# Patient Record
Sex: Male | Born: 1985 | Race: White | Hispanic: No | Marital: Single | Smoking: Never smoker
Health system: Southern US, Community
[De-identification: ages and names within clinical notes are randomized; demographics above are authoritative.]

## PROBLEM LIST (undated history)

## (undated) DIAGNOSIS — Z8669 Personal history of other diseases of the nervous system and sense organs: Secondary | ICD-10-CM

## (undated) DIAGNOSIS — K529 Noninfective gastroenteritis and colitis, unspecified: Secondary | ICD-10-CM

## (undated) HISTORY — PX: NO PAST SURGERIES: SHX2092

---

## 2012-02-05 ENCOUNTER — Emergency Department: Payer: Self-pay | Admitting: Emergency Medicine

## 2013-12-06 ENCOUNTER — Ambulatory Visit: Payer: Self-pay | Admitting: Family Medicine

## 2014-12-31 ENCOUNTER — Encounter: Payer: Self-pay | Admitting: Family Medicine

## 2014-12-31 ENCOUNTER — Ambulatory Visit (INDEPENDENT_AMBULATORY_CARE_PROVIDER_SITE_OTHER): Payer: Self-pay | Admitting: Family Medicine

## 2014-12-31 VITALS — BP 92/62 | HR 72 | Temp 97.9°F | Resp 16 | Wt 156.6 lb

## 2014-12-31 DIAGNOSIS — L600 Ingrowing nail: Secondary | ICD-10-CM

## 2014-12-31 MED ORDER — CEPHALEXIN 500 MG PO CAPS: 500.0000 mg | ORAL_CAPSULE | Freq: Two times a day (BID) | ORAL | Status: DC

## 2014-12-31 NOTE — Progress Notes (Signed)
Subjective:     Patient ID: Ryan Cooley, male   DOB: January 10, 1986, 29 y.o.   MRN: [id number]  HPI  Chief Complaint  Patient presents with  . Foot Pain    Patient comes in office today concerned about pain in his right foot radiating down to his toes for the past month. Patient states when problem initally began he was seen at [facility] care and was diagnosed with ingrown toenail.   States now the opposite side of his right first toenail is ingrown as well. Reports adjacent second and third toes also have been getting sharp pains in them from time to time. Works at [employer] 30-40 hours/week. States he had partial toenail removal at Urgent Care but does not wish to have procedure today. Has just started Epsom Salt Soaks. No insurance coverage at this time.   Review of Systems  Musculoskeletal:       History or right fourth metatarsal fracture evaluated by podiatry, Dr. Alberteen Spindle.       Objective:   Physical Exam  Constitutional: He appears well-developed and well-nourished. No distress.  Cardiovascular:  Pulses:      Dorsalis pedis pulses are 2+ on the right side.       Posterior tibial pulses are 2+ on the right side.  Skin:  No tenderness or erythema @ second and third toes. First toe has half of his nail remaining after prior procedure  with lateral aspect ingrown with granulation tissue and erythema present.       Assessment:    1. Ingrown right big toenail - cephALEXin (KEFLEX) 500 MG capsule; Take 1 capsule (500 mg total) by mouth 2 (two) times daily.  Dispense: 12 capsule; Refill: 0    Plan:    Epsom Salt Soaks in warm water 1-2 x day. Work excuse for 9/20.

## 2014-12-31 NOTE — Patient Instructions (Signed)
Continue Epsom Salt soaks in warm water 1-2 x day.

## 2015-01-21 ENCOUNTER — Encounter: Payer: Self-pay | Admitting: Family Medicine

## 2015-01-21 ENCOUNTER — Ambulatory Visit: Payer: Self-pay | Admitting: Family Medicine

## 2015-01-21 VITALS — BP 110/64 | HR 72 | Temp 97.6°F | Resp 16 | Wt 155.8 lb

## 2015-01-21 DIAGNOSIS — Z789 Other specified health status: Secondary | ICD-10-CM | POA: Insufficient documentation

## 2015-01-21 DIAGNOSIS — L600 Ingrowing nail: Secondary | ICD-10-CM

## 2015-01-21 NOTE — Patient Instructions (Signed)
Continue salt water soaks and dressing until you see the podiatrist

## 2015-01-21 NOTE — Progress Notes (Signed)
Subjective:     Patient ID: Ryan Cooley, male   DOB: 11/11/1985, 29 y.o.   MRN: [id number]  HPI  Chief Complaint  Patient presents with  . Ingrown Toenail    Patient returns to office today to address ingrown toe nail on his great toe. Patient reports that he completed medication prescribed last office  visit and was soaking feet in Epison salt. Ptient reports that symptoms had improved but over the past week he has noted pain on that toe again.   States it has resumed being painful. He has continued to use Epsom salt soaks and attempted to place a pledget of cotton under the nail to lift it up. Has previously seen [facility] podiatry for an unrelated problem  Review of Systems     Objective:   Physical Exam  Constitutional: He appears well-developed and well-nourished. No distress.  Skin:  Right first ingrown toenail appears worse today with more extensive granulation tissue and mild bleeding noted. He has had opposite side of nail previously removed but appears to have some early separation of the nail from the nail base.       Assessment:    1. Ingrown right big toenail: suspect he will require toenail removal and ablation. - Ambulatory referral to Podiatry    Plan:    Continue soaks and dressing until seen by podiatry.

## 2015-01-25 DIAGNOSIS — S99919A Unspecified injury of unspecified ankle, initial encounter: Secondary | ICD-10-CM | POA: Insufficient documentation

## 2015-01-25 DIAGNOSIS — K529 Noninfective gastroenteritis and colitis, unspecified: Secondary | ICD-10-CM | POA: Insufficient documentation

## 2015-01-25 DIAGNOSIS — H60399 Other infective otitis externa, unspecified ear: Secondary | ICD-10-CM | POA: Insufficient documentation

## 2015-01-25 DIAGNOSIS — Z8669 Personal history of other diseases of the nervous system and sense organs: Secondary | ICD-10-CM | POA: Insufficient documentation

## 2015-01-25 DIAGNOSIS — S8990XA Unspecified injury of unspecified lower leg, initial encounter: Secondary | ICD-10-CM | POA: Insufficient documentation

## 2015-01-25 DIAGNOSIS — A09 Infectious gastroenteritis and colitis, unspecified: Secondary | ICD-10-CM

## 2015-01-25 DIAGNOSIS — M722 Plantar fascial fibromatosis: Secondary | ICD-10-CM | POA: Insufficient documentation

## 2015-01-25 DIAGNOSIS — Z789 Other specified health status: Secondary | ICD-10-CM | POA: Insufficient documentation

## 2015-01-25 DIAGNOSIS — K120 Recurrent oral aphthae: Secondary | ICD-10-CM | POA: Insufficient documentation

## 2015-03-04 ENCOUNTER — Ambulatory Visit (INDEPENDENT_AMBULATORY_CARE_PROVIDER_SITE_OTHER): Payer: Self-pay | Admitting: Family Medicine

## 2015-03-04 ENCOUNTER — Encounter: Payer: Self-pay | Admitting: Family Medicine

## 2015-03-04 VITALS — BP 110/66 | HR 69 | Temp 97.7°F | Resp 16 | Wt 159.0 lb

## 2015-03-04 DIAGNOSIS — J0130 Acute sphenoidal sinusitis, unspecified: Secondary | ICD-10-CM

## 2015-03-04 DIAGNOSIS — J3089 Other allergic rhinitis: Secondary | ICD-10-CM

## 2015-03-04 DIAGNOSIS — J309 Allergic rhinitis, unspecified: Secondary | ICD-10-CM | POA: Insufficient documentation

## 2015-03-04 MED ORDER — AMOXICILLIN 500 MG PO CAPS: ORAL_CAPSULE | ORAL | Status: DC

## 2015-03-04 NOTE — Progress Notes (Signed)
Subjective:     Patient ID: Ryan Cooley, male   DOB: [birth year], 29 y.o.   MRN: [id number]  HPI  Chief Complaint  Patient presents with  . Headache    Patient comes in office today with complaints of headache and congestion over the past 3 days. Patient states that headache is located on top of his head and is described as a pressure feeling. Patient reports taking otc Advil for relief.   States he has perennial sinus congestion for which he uses steroid nasal spray. In the last 3 days he has developed temporal to parietal pressure headache associated with purulent drainage. No sore throat, sinus drainage, or cough reported. Has been using two Advil every 4-6 hours for pain   Review of Systems  Constitutional: Negative for fever and chills.  Neurological:       No hx of headache syndromes. Reports his brother has migraines.       Objective:   Physical Exam  Constitutional: He appears well-developed and well-nourished. No distress.  Ears: T.M's intact without inflammation Sinuses: non-tender Throat: no tonsillar enlargement or exudate Neck: no cervical adenopathy Lungs: clear     Assessment:    1. Acute sphenoidal sinusitis, recurrence not specified - amoxicillin (AMOXIL) 500 MG capsule; Two pills twice daily  Dispense: 40 capsule; Refill: 0    Plan:    Discussed use of decongestants and also possible diagnosis of rebound headache. Asked him to stop Advil.

## 2015-03-04 NOTE — Patient Instructions (Signed)
Stop all pain medication if possible. Use Sudafed or similar decongestant for congestion.

## 2015-04-19 ENCOUNTER — Ambulatory Visit (INDEPENDENT_AMBULATORY_CARE_PROVIDER_SITE_OTHER): Payer: Self-pay | Admitting: Family Medicine

## 2015-04-19 ENCOUNTER — Encounter: Payer: Self-pay | Admitting: Family Medicine

## 2015-04-19 VITALS — BP 110/58 | HR 68 | Temp 97.7°F | Resp 16 | Wt 155.4 lb

## 2015-04-19 DIAGNOSIS — J069 Acute upper respiratory infection, unspecified: Secondary | ICD-10-CM

## 2015-04-19 NOTE — Patient Instructions (Signed)
Discussed use of Mucinex D for congestion, Delsym for cough, and Benadryl for postnasal drainage 

## 2015-04-19 NOTE — Progress Notes (Signed)
Subjective:     Patient ID: Ryan Cooley, male   DOB: 1986/01/25, 30 y.o.   MRN: [id number]  HPI  Chief Complaint  Patient presents with  . Cough    Patient comes in office today with concerns of cough and headache for the pat 4 days. Patient states that he has a sore throat that is intermittent and described as dull. Patient has taken otc: decongestant, chloroseptic spray, cough syrup and allergy medication  No fever,chills, or body aches reported. Works in [employer].   Review of Systems     Objective:   Physical Exam  Constitutional: He appears well-developed and well-nourished. No distress.  Ears: T.M's intact without inflammation Throat: no tonsillar enlargement or exudate Neck: no cervical adenopathy Lungs: clear     Assessment:    1. Upper respiratory infection    Plan:    Discussed use of Mucinex D for congestion, Delsym for cough, and Benadryl for postnasal drainage. Work excuse for 1/6-1/8.

## 2015-05-07 ENCOUNTER — Encounter: Payer: Self-pay | Admitting: Family Medicine

## 2015-05-07 ENCOUNTER — Ambulatory Visit (INDEPENDENT_AMBULATORY_CARE_PROVIDER_SITE_OTHER): Payer: Self-pay | Admitting: Family Medicine

## 2015-05-07 VITALS — BP 96/60 | HR 70 | Temp 97.6°F | Resp 16 | Wt 153.2 lb

## 2015-05-07 DIAGNOSIS — J069 Acute upper respiratory infection, unspecified: Secondary | ICD-10-CM

## 2015-05-07 NOTE — Patient Instructions (Signed)
Add decongestants like Sudafed PE or Mucinex D. May add Benadryl or Nyquil at night if post nasal drip is keeping you awake

## 2015-05-07 NOTE — Progress Notes (Signed)
Subjective:     Patient ID: Ryan Cooley, male   DOB: 1986/01/31, 30 y.o.   MRN: [id number]  HPI  Chief Complaint  Patient presents with  . Cough    Patient comes in office today with concerns of cough and congestion for the past 5 days. Patient reports that last visit he had similar symptoms but they did resolve. Patient complains of the following symptoms: productive cough, runny nose, congestion and post nasal drip. Patient has tried otc Deslym and Mucinex DM for relief  Denies specific fever but has felt chills today.   Review of Systems     Objective:   Physical Exam  Constitutional: He appears well-developed and well-nourished. No distress.  Ears: T.M's intact without inflammation Throat: no tonsillar enlargement or exudate Neck: no cervical adenopathy Lungs: clear     Assessment:    1. Upper respiratory infection     Plan:    Discussed otc medication. Work excuse for 1/24-1/26.

## 2015-05-24 ENCOUNTER — Emergency Department
Admission: EM | Admit: 2015-05-24 | Discharge: 2015-05-24 | Disposition: A | Discharge status: Home / Self Care | Payer: Self-pay | Attending: Emergency Medicine | Admitting: Emergency Medicine

## 2015-05-24 ENCOUNTER — Encounter: Payer: Self-pay | Admitting: Emergency Medicine

## 2015-05-24 ENCOUNTER — Emergency Department: Payer: Self-pay

## 2015-05-24 DIAGNOSIS — M545 Low back pain, unspecified: Secondary | ICD-10-CM

## 2015-05-24 DIAGNOSIS — G8929 Other chronic pain: Secondary | ICD-10-CM | POA: Insufficient documentation

## 2015-05-24 DIAGNOSIS — Z79899 Other long term (current) drug therapy: Secondary | ICD-10-CM | POA: Insufficient documentation

## 2015-05-24 MED ORDER — METHOCARBAMOL 750 MG PO TABS
750.0000 mg | ORAL_TABLET | Freq: Four times a day (QID) | ORAL | Status: DC
Start: 2015-05-24 — End: 2015-10-31

## 2015-05-24 MED ORDER — TRAMADOL HCL 50 MG PO TABS: 50.0000 mg | ORAL_TABLET | Freq: Four times a day (QID) | ORAL | Status: DC | PRN

## 2015-05-24 MED ORDER — IBUPROFEN 800 MG PO TABS
800.0000 mg | ORAL_TABLET | Freq: Once | ORAL | Status: AC
  Administered 2015-05-24: 800 mg via ORAL
  Filled 2015-05-24: qty 1

## 2015-05-24 MED ORDER — METHOCARBAMOL 500 MG PO TABS
1000.0000 mg | ORAL_TABLET | Freq: Once | ORAL | Status: AC
  Administered 2015-05-24: 1000 mg via ORAL
  Filled 2015-05-24: qty 2

## 2015-05-24 MED ORDER — OXYCODONE-ACETAMINOPHEN 5-325 MG PO TABS
1.0000 | ORAL_TABLET | Freq: Once | ORAL | Status: AC
  Administered 2015-05-24: 1 via ORAL
  Filled 2015-05-24: qty 1

## 2015-05-24 MED ORDER — IBUPROFEN 600 MG PO TABS: 600.0000 mg | ORAL_TABLET | Freq: Four times a day (QID) | ORAL | Status: DC | PRN

## 2015-05-24 NOTE — Discharge Instructions (Signed)

## 2015-05-24 NOTE — ED Provider Notes (Signed)
[facility] Emergency Department Provider Note  ____________________________________________  Time seen: Approximately 5:05 PM  I have reviewed the triage vital signs and the nursing notes.   HISTORY  Chief Complaint Back Pain    HPI Ryan Cooley is a 30 y.o. male patient complaining of acute onset of low back pain 2 hours ago secondary to infection incident. Patient has a history of chronic back pain for about 3 years and went to see a Chiropractor yesterday. He felt better after the session. No palliative measures taken to today's complaint. He does rate his pain as 8/10. Patient denies any radicular component to his pain. He denies any bladder or bowel dysfunction. Patient does not recall in the emergency of his back in the past 3-4 years.  History reviewed. No pertinent past medical history.  Patient Active Problem List   Diagnosis Date Noted  . Allergic rhinitis 03/04/2015    Past Surgical History  Procedure Laterality Date  . No past surgeries      Current Outpatient Rx  Name  Route  Sig  Dispense  Refill  . ibuprofen (ADVIL,MOTRIN) 100 MG tablet   Oral   Take 100 mg by mouth every 6 (six) hours as needed for fever.         Marland Kitchen ibuprofen (ADVIL,MOTRIN) 600 MG tablet   Oral   Take 1 tablet (600 mg total) by mouth every 6 (six) hours as needed.   30 tablet   0   . methocarbamol (ROBAXIN-750) 750 MG tablet   Oral   Take 1 tablet (750 mg total) by mouth 4 (four) times daily.   20 tablet   0   . Multiple Vitamin (MULTI-VITAMINS) TABS   Oral   Take by mouth.         . traMADol (ULTRAM) 50 MG tablet   Oral   Take 1 tablet (50 mg total) by mouth every 6 (six) hours as needed.   20 tablet   0   . triamcinolone (NASACORT ALLERGY 24HR) 55 MCG/ACT AERO nasal inhaler   Nasal   Place 2 sprays into the nose daily.           Allergies Review of patient's allergies indicates no known allergies.  Family History  Problem Relation  Age of Onset  . Depression Mother   . Diabetes Father   . Healthy Brother   . Diabetes Maternal Grandmother   . Heart disease Maternal Grandmother   . Cancer Maternal Grandfather     lung  . Healthy Brother     Social History Social History  Substance Use Topics  . Smoking status: Never Smoker   . Smokeless tobacco: Never Used  . Alcohol Use: No    Review of Systems Constitutional: No fever/chills Eyes: No visual changes. ENT: No sore throat. Cardiovascular: Denies chest pain. Respiratory: Denies shortness of breath. Gastrointestinal: No abdominal pain.  No nausea, no vomiting.  No diarrhea.  No constipation. Genitourinary: Negative for dysuria. Musculoskeletal: Positive for chronic back pain  Skin: Negative for rash. Neurological: Negative for headaches, focal weakness or numbness.   ____________________________________________   PHYSICAL EXAM:  VITAL SIGNS: ED Triage Vitals  Enc Vitals Group     BP 05/24/15 1652 118/65 mmHg     Pulse Rate 05/24/15 1652 63     Resp 05/24/15 1652 18     Temp 05/24/15 1652 98 F (36.7 C)     Temp Source 05/24/15 1652 Oral     SpO2 05/24/15 1652  99 %     Weight 05/24/15 1652 150 lb (68.04 kg)     Height 05/24/15 1652  (1.626 m)     Head Cir --      Peak Flow --      Pain Score 05/24/15 1651 8     Pain Loc --      Pain Edu? --      Excl. in GC? --     Constitutional: Alert and oriented. Well appearing and in no acute distress. Eyes: Conjunctivae are normal. PERRL. EOMI. Head: Atraumatic. Nose: No congestion/rhinnorhea. Mouth/Throat: Mucous membranes are moist.  Oropharynx non-erythematous. Neck: No stridor.  No cervical spine tenderness to palpation. Hematological/Lymphatic/Immunilogical: No cervical lymphadenopathy. Cardiovascular: Normal rate, regular rhythm. Grossly normal heart sounds.  Good peripheral circulation. Respiratory: Normal respiratory effort.  No retractions. Lungs CTAB. Gastrointestinal: Soft and  nontender. No distention. No abdominal bruits. No CVA tenderness. Musculoskeletal: No obvious deformity of the low back. Moderate guarding palpation L4-S1. Decreased range of motion with flexion. Patient Leg test.  Neurologic:  Normal speech and language. No gross focal neurologic deficits are appreciated. No gait instability. Skin:  Skin is warm, dry and intact. No rash noted. Psychiatric: Mood and affect are normal. Speech and behavior are normal.  ____________________________________________   LABS (all labs ordered are listed, but only abnormal results are displayed)  Labs Reviewed - No data to display ____________________________________________  EKG   ____________________________________________  RADIOLOGY  No acute findings on x-ray. I, Joni Reining, personally viewed and evaluated these images (plain radiographs) as part of my medical decision making, as well as reviewing the written report by the radiologist.  ____________________________________________   PROCEDURES  Procedure(s) performed: None  Critical Care performed: No  ____________________________________________   INITIAL IMPRESSION / ASSESSMENT AND PLAN / ED COURSE  Pertinent labs & imaging results that were available during my care of the patient were reviewed by me and considered in my medical decision making (see chart for details).  Treatment low back pain. Patient given discharge care instructions. Patient advised to wear lumbar support while working. Patient get a prescription for tramadol, ibuprofen, Robaxin. Patient advised follow-up family doctor for continued care. Patient given a work note. ____________________________________________   FINAL CLINICAL IMPRESSION(S) / ED DIAGNOSES  Final diagnoses:  Back pain at L4-L5 level      Joni Reining, PA-C 05/24/15 1807  Jennye Moccasin, MD 05/24/15 [phone]

## 2015-05-24 NOTE — ED Notes (Signed)
Patient c/o lower back pain. States he was at the chiropractor yesterday. Patient states he has had problems with his back for about 3 years, but "its normally not this bad".

## 2015-05-24 NOTE — ED Notes (Signed)
Reports lower back pain onset today.

## 2015-06-13 ENCOUNTER — Encounter: Payer: Self-pay | Admitting: Family Medicine

## 2015-06-13 ENCOUNTER — Other Ambulatory Visit: Payer: Self-pay | Admitting: Family Medicine

## 2015-06-13 ENCOUNTER — Ambulatory Visit (INDEPENDENT_AMBULATORY_CARE_PROVIDER_SITE_OTHER): Payer: Self-pay | Admitting: Family Medicine

## 2015-06-13 VITALS — BP 102/62 | HR 60 | Temp 97.7°F | Resp 16 | Wt 150.4 lb

## 2015-06-13 DIAGNOSIS — L02439 Carbuncle of limb, unspecified: Secondary | ICD-10-CM

## 2015-06-13 MED ORDER — HYDROCODONE-ACETAMINOPHEN 5-325 MG PO TABS: ORAL_TABLET | ORAL | Status: DC

## 2015-06-13 NOTE — Progress Notes (Signed)
Subjective:     Patient ID: AYUSH BOULET, male   DOB: [birth year], 30 y.o.   MRN: [id number]  HPI  Chief Complaint  Patient presents with  . Skin Problem    Patient comes in office today for skin check, patient states that early last week he had noticed a "bump" underneath his left arm pit area. Patient reports that "bump" has grown in size and has become painful to the touch, he has been applying warm compress to area for relief.   No hx of or exposure to MRSA   Review of Systems     Objective:   Physical Exam  Constitutional: He appears well-developed and well-nourished. He appears distressed (mild pain).  Skin:  L axilla with large carbuncle Procedure note: Cleansed with betadine, I & D performed with large amount of pus return. C &S sent.       Assessment:    1. Carbuncle of axilla - INCISION AND DRAINAGE; Future - Wound culture - HYDROcodone-acetaminophen (NORCO/VICODIN) 5-325 MG tablet; One every 4-6 hours as needed for pain  Dispense: 14 tablet; Refill: 0    Plan:    Cleanse with soap and water. Further f/u pending culture result.

## 2015-06-13 NOTE — Patient Instructions (Signed)
Cleanse with soap and water and apply dressing. We will call you with the culture result.

## 2015-06-17 LAB — WOUND CULTURE

## 2015-06-18 ENCOUNTER — Telehealth: Payer: Self-pay

## 2015-06-18 NOTE — Telephone Encounter (Signed)
States wound healing well and no longer draining. Discussed results of wound culture.

## 2015-06-18 NOTE — Telephone Encounter (Signed)
Pt is returning call.  CB#[phone]/MW

## 2015-06-18 NOTE — Telephone Encounter (Signed)
[id number] 

## 2015-06-18 NOTE — Telephone Encounter (Signed)
-----   Message from Anola Gurneyobert Chauvin, [place] sent at 06/17/2015  4:56 PM EST ----- Routine staphylococcus (not MRSA) on wound culture. Is he healing well?

## 2015-10-31 ENCOUNTER — Ambulatory Visit (INDEPENDENT_AMBULATORY_CARE_PROVIDER_SITE_OTHER): Payer: Self-pay | Admitting: Family Medicine

## 2015-10-31 ENCOUNTER — Encounter: Payer: Self-pay | Admitting: Family Medicine

## 2015-10-31 VITALS — BP 124/72 | HR 68 | Temp 98.2°F | Resp 16 | Wt 151.0 lb

## 2015-10-31 DIAGNOSIS — L03116 Cellulitis of left lower limb: Secondary | ICD-10-CM

## 2015-10-31 MED ORDER — AMOXICILLIN-POT CLAVULANATE 875-125 MG PO TABS: 1.0000 | ORAL_TABLET | Freq: Two times a day (BID) | ORAL | Status: DC

## 2015-10-31 NOTE — Progress Notes (Signed)
Subjective:     Patient ID: Bruce Donathric J Chapdelaine, male   DOB: May 08, 1985, 30 y.o.   MRN: [id number]  HPI  Chief Complaint  Patient presents with  . Knee Pain    x 4 days. Is red and swollen with red streak running up left thigh. Has splinter in knee, which pt removed "sterilized needle".   States it started out as a pimple and progressively got more painful and swollen over the course of the week. He has been applying warm compresses and peroxide. Recent axillary carbuncle with Non-MRSA staph on culture. 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   Review of Systems     Objective:   Physical Exam  Constitutional: He appears well-developed and well-nourished. No distress.  Skin:  Left anterior knee is inflamed and mildly swollen. Streak is noted running proximally. Small central eschar at site of prior "bathroom" surgery. No pointing but is very tender to the touch.       Assessment:    1. Cellulitis of knee, left - amoxicillin-clavulanate (AUGMENTIN) 875-125 MG tablet; Take 1 tablet by mouth 2 (two) times daily.  Dispense: 20 tablet; Refill: 0    Plan:    Report to Urgent Care over the weekend if not improving with less erythema and tenderness.

## 2015-10-31 NOTE — Patient Instructions (Signed)
Continue warm compresses. If redness and pain not improving over the weekend report to Urgent Care.

## 2016-01-30 ENCOUNTER — Ambulatory Visit (INDEPENDENT_AMBULATORY_CARE_PROVIDER_SITE_OTHER): Payer: Self-pay | Admitting: Family Medicine

## 2016-01-30 ENCOUNTER — Encounter: Payer: Self-pay | Admitting: Family Medicine

## 2016-01-30 VITALS — BP 106/60 | HR 72 | Temp 97.5°F | Resp 16 | Wt 157.2 lb

## 2016-01-30 DIAGNOSIS — L02439 Carbuncle of limb, unspecified: Secondary | ICD-10-CM

## 2016-01-30 MED ORDER — AMOXICILLIN-POT CLAVULANATE 875-125 MG PO TABS: 1.0000 | ORAL_TABLET | Freq: Two times a day (BID) | ORAL | 0 refills | Status: DC

## 2016-01-30 NOTE — Progress Notes (Signed)
Subjective:     Patient ID: Ryan Cooley, male   DOB: 12-17-1985, 30 y.o.   MRN: [id number]  HPI  Chief Complaint  Patient presents with  . Skin Problem    Patient comes in office today with concerns of a possible knot on skin for the past week. Patient states that "knot" has grown larger and painful in the past two days. Patient denies any history of MRSA  infection.  Reports he did have a small pus pocket which he attempted to squeeze. Prior hx of staph axilla carbuncle.   Review of Systems     Objective:   Physical Exam  Constitutional: He appears well-developed and well-nourished. No distress.  Skin:  Left anterior thigh 6 cm area of erythema with 1 cm central pus pocket. Procedure note: Cleansed with alcohol and infiltrated with xylocaine with Epi. I & D performed with small amount of pus return. C & S obtained.       Assessment:    1. Carbuncle of thigh - WOUND CULTURE - INCISION AND DRAINAGE; Future - amoxicillin-clavulanate (AUGMENTIN) 875-125 MG tablet; Take 1 tablet by mouth 2 (two) times daily.  Dispense: 14 tablet; Refill: 0    Plan:    May hold antibiotic until culture available. May start abx if sx worsening prior to culture result. Cleanse with soap and water daily and apply dressing.

## 2016-01-30 NOTE — Patient Instructions (Addendum)
Clean wound with soap and water and apply dressing. We will call you with the culture report. For future pimples use an antibiotic cream or ointment like Triple antibiotic.

## 2016-02-01 LAB — WOUND CULTURE

## 2016-02-04 ENCOUNTER — Telehealth: Payer: Self-pay

## 2016-02-04 NOTE — Telephone Encounter (Signed)
[id number] 

## 2016-02-04 NOTE — Telephone Encounter (Signed)
-----   Message from Anola Gurneyobert Chauvin, [place] sent at 02/03/2016  7:31 AM EDT ----- Routine staph infection. If wound is looking and feeling better probably does not need antibiotics.

## 2016-02-05 NOTE — Telephone Encounter (Signed)
Patient was advised he states that he does not have any drainage from would but area is still red, site that was cutt open has now scabbed over. Patient was advised to monitor and call back if symptoms worsen. KW

## 2016-07-15 ENCOUNTER — Telehealth: Payer: Self-pay

## 2016-07-15 ENCOUNTER — Ambulatory Visit (INDEPENDENT_AMBULATORY_CARE_PROVIDER_SITE_OTHER): Payer: Self-pay | Admitting: Physician Assistant

## 2016-07-15 ENCOUNTER — Encounter: Payer: Self-pay | Admitting: Physician Assistant

## 2016-07-15 VITALS — BP 104/68 | HR 88 | Temp 97.9°F | Resp 16 | Wt 156.0 lb

## 2016-07-15 DIAGNOSIS — A084 Viral intestinal infection, unspecified: Secondary | ICD-10-CM

## 2016-07-15 NOTE — Patient Instructions (Addendum)
Immodium for diarrhea. May continue with pepto bismol. Tums for acid/hearturn symptoms. Bland diet.  Viral Gastroenteritis, Adult Viral gastroenteritis is also known as the stomach flu. This condition is caused by certain germs (viruses). These germs can be passed from person to person very easily (are very contagious). This condition can cause sudden watery poop (diarrhea), fever, and throwing up (vomiting). Having watery poop and throwing up can make you feel weak and cause you to get dehydrated. Dehydration can make you tired and thirsty, make you have a dry mouth, and make it so you pee (urinate) less often. Older adults and people with other diseases or a weak defense system (immune system) are at higher risk for dehydration. It is important to replace the fluids that you lose from having watery poop and throwing up. Follow these instructions at home: Follow instructions from your doctor about how to care for yourself at home. Eating and drinking   Follow these instructions as told by your doctor:  Take an oral rehydration solution (ORS). This is a drink that is sold at pharmacies and stores.  Drink clear fluids in small amounts as you are able, such as:  Water.  Ice chips.  Diluted fruit juice.  Low-calorie sports drinks.  Eat bland, easy-to-digest foods in small amounts as you are able, such as:  Bananas.  Applesauce.  Rice.  Low-fat (lean) meats.  Toast.  Crackers.  Avoid fluids that have a lot of sugar or caffeine in them.  Avoid alcohol.  Avoid spicy or fatty foods. General instructions   Drink enough fluid to keep your pee (urine) clear or pale yellow.  Wash your hands often. If you cannot use soap and water, use hand sanitizer.  Make sure that all people in your home wash their hands well and often.  Rest at home while you get better.  Take over-the-counter and prescription medicines only as told by your doctor.  Watch your condition for any  changes.  Take a warm bath to help with any burning or pain from having watery poop.  Keep all follow-up visits as told by your doctor. This is important. Contact a doctor if:  You cannot keep fluids down.  Your symptoms get worse.  You have new symptoms.  You feel light-headed or dizzy.  You have muscle cramps. Get help right away if:  You have chest pain.  You feel very weak or you pass out (faint).  You see blood in your throw-up.  Your throw-up looks like coffee grounds.  You have bloody or black poop (stools) or poop that look like tar.  You have a very bad headache, a stiff neck, or both.  You have a rash.  You have very bad pain, cramping, or bloating in your belly (abdomen).  You have trouble breathing.  You are breathing very quickly.  Your heart is beating very quickly.  Your skin feels cold and clammy.  You feel confused.  You have pain when you pee.  You have signs of dehydration, such as:  Dark pee, hardly any pee, or no pee.  Cracked lips.  Dry mouth.  Sunken eyes.  Sleepiness.  Weakness. This information is not intended to replace advice given to you by your health care provider. Make sure you discuss any questions you have with your health care provider. Document Released: 09/16/2007 Document Revised: 10/18/2015 Document Reviewed: 12/04/2014 Elsevier Interactive Patient Education  2017 [employer].

## 2016-07-15 NOTE — Progress Notes (Signed)
Patient: Ryan Cooley Male    DOB: 06-29-1985   31 y.o.   MRN: [id number] Visit Date: 07/15/2016  Today's Provider: Trey Sailors, PA-C   Chief Complaint  Patient presents with  . Abdominal Pain   Subjective:    Abdominal Pain  This is a new problem. The current episode started in the past 7 days (x 3 days). The problem has been gradually worsening. The pain is located in the periumbilical region. The pain is moderate. Quality: "general discomfort" The abdominal pain does not radiate. Associated symptoms include anorexia, diarrhea, headaches and nausea. Pertinent negatives include no belching, constipation, dysuria, fever, flatus, frequency, hematochezia, hematuria, melena, vomiting or weight loss. The pain is aggravated by eating. The pain is relieved by bowel movements. Treatments tried: Pepto Bismol. The treatment provided no relief.   Patient with no contributory PMH presenting today with diarrhea since Sunday. Has multiple sick contacts with similar symptoms including his parents and siblings. Mild epigastric pain. Episodes of watery diarrhea on Sunday that improved to slightly formed diarrhea currently. Intermittent but rare nausea, no vomiting. Is able to keep down food with some indigestion. Drinking okay. No alcohol use. No radiation of epigastric tenderness. Urinating okay. Requesting work note.    No Known Allergies   Current Outpatient Prescriptions:  .  Cholecalciferol (VITAMIN D3) 1000 units CAPS, Take 1 capsule by mouth daily., Disp: , Rfl:  .  ibuprofen (ADVIL,MOTRIN) 600 MG tablet, Take 1 tablet (600 mg total) by mouth every 6 (six) hours as needed., Disp: 30 tablet, Rfl: 0 .  Multiple Vitamin (MULTI-VITAMINS) TABS, Take by mouth., Disp: , Rfl:  .  triamcinolone (NASACORT ALLERGY 24HR) 55 MCG/ACT AERO nasal inhaler, Place 2 sprays into the nose daily., Disp: , Rfl:   Review of Systems  Constitutional: Negative for fever and weight loss.  Gastrointestinal:  Positive for abdominal pain, anorexia, diarrhea and nausea. Negative for constipation, flatus, hematochezia, melena and vomiting.  Genitourinary: Negative for dysuria, frequency and hematuria.  Neurological: Positive for headaches.    Social History  Substance Use Topics  . Smoking status: Never Smoker  . Smokeless tobacco: Never Used  . Alcohol use No   Objective:   BP 104/68 (BP Location: Left Arm, Patient Position: Sitting, Cuff Size: Normal)   Pulse 88   Temp 97.9 F (36.6 C) (Oral)   Resp 16   Wt 156 lb (70.8 kg)   BMI 26.78 kg/m  Vitals:   07/15/16 1604  BP: 104/68  Pulse: 88  Resp: 16  Temp: 97.9 F (36.6 C)  TempSrc: Oral  Weight: 156 lb (70.8 kg)     Physical Exam  Constitutional: He appears well-developed and well-nourished. No distress.  Cardiovascular: Normal rate and regular rhythm.   Pulmonary/Chest: Effort normal and breath sounds normal.  Abdominal: He exhibits no distension. Bowel sounds are increased. There is tenderness in the epigastric area. There is no rebound and no guarding.  Vitals reviewed.       Assessment & Plan:     1. Viral gastroenteritis  Hydrate well, immodium for diarrhea. Can use pepto bismol for digestion. May use tums if having acid reflux. Should resolve soon. Call back if needed. Provided worknote today.  The entirety of the information documented in the History of Present Illness, Review of Systems and Physical Exam were personally obtained by me. Portions of this information were initially documented by Irving Burton D and reviewed by me for thoroughness and accuracy.   Return  if symptoms worsen or fail to improve.           Trey Sailors, PA-C  [facility]

## 2016-07-15 NOTE — Telephone Encounter (Signed)
Patient called stating that he has had diarrhea, abdominal pain and nausea for the past 3 days. Patient states that the abdominal pain worsens when he eats. Patient has been drinking plenty of fluids. Patient denies any fever or body aches. Appointment has been scheduled today at 4pm. This is the soonest that patient could come in. Patient is fatigued because he has been up taking his relative to doctors appointments.

## 2016-09-24 ENCOUNTER — Ambulatory Visit (INDEPENDENT_AMBULATORY_CARE_PROVIDER_SITE_OTHER): Payer: Self-pay | Admitting: Family Medicine

## 2016-09-24 ENCOUNTER — Encounter: Payer: Self-pay | Admitting: Family Medicine

## 2016-09-24 VITALS — BP 110/74 | HR 64 | Temp 97.8°F | Resp 16 | Wt 159.2 lb

## 2016-09-24 DIAGNOSIS — J069 Acute upper respiratory infection, unspecified: Secondary | ICD-10-CM

## 2016-09-24 NOTE — Progress Notes (Signed)
Subjective:     Patient ID: Ryan Cooley, male   DOB: Aug 18, 1985, 31 y.o.   MRN: [id number]  HPI  Chief Complaint  Patient presents with  . Laryngitis    Patient comes in office today with complaints of layrngitis and the feeling of "raw throat" for the past 4 days. Patient denies that he has a sore throat or difficulty swallowing liquids or solifs. Patient states that he has had a productive cough with yellow phlegm and has been taking otc Dayquil  Reports chronic sinus congestion he attributes to allergies prior to the onset of cold-like sx on 6/10. No fever reported. Has been using one squirt of Nasacort per nostril. 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   Review of Systems     Objective:   Physical Exam  Constitutional: He appears well-developed and well-nourished. No distress.  Ears: T.M's dull and partially obstructed by cerumen. Throat: no tonsillar enlargement or exudate Neck: no cervical adenopathy Lungs: clear     Assessment:    1. Viral upper respiratory tract infection    Plan:    Discussed otc medication and increased dose of Nasacort to two squirts each nostril daily   Work excuse for 6/14-6/16/18.

## 2016-09-24 NOTE — Patient Instructions (Signed)
Discussed use of Mucinex D for congestion and increasing Nasacort to two squirts each nostril daily. May use Delsym for cough. Let me know if your not improving after this weekend.

## 2018-03-14 ENCOUNTER — Other Ambulatory Visit: Payer: Self-pay

## 2018-03-14 ENCOUNTER — Encounter: Payer: Self-pay | Admitting: Family Medicine

## 2018-03-14 ENCOUNTER — Ambulatory Visit: Payer: Self-pay | Admitting: Family Medicine

## 2018-03-14 VITALS — BP 126/86 | HR 83 | Temp 98.0°F | Ht 65.0 in | Wt 175.2 lb

## 2018-03-14 DIAGNOSIS — L02435 Carbuncle of right lower limb: Secondary | ICD-10-CM

## 2018-03-14 MED ORDER — AMOXICILLIN-POT CLAVULANATE 875-125 MG PO TABS: 1.0000 | ORAL_TABLET | Freq: Two times a day (BID) | ORAL | 0 refills | Status: DC

## 2018-03-14 NOTE — Patient Instructions (Addendum)
Pick up some salt water solution (0.9 % saline) and cleanse your leg twice daily and apply a dry dressing. When you change dressings it will help debride the boil area. If not improving over the next week with cleaning and antibiotics come in for further evaluation.

## 2018-03-14 NOTE — Progress Notes (Signed)
Subjective:     Patient ID: Ryan Cooley, male   DOB: July 17, 1985, 32 y.o.   MRN: [id number] Chief Complaint  Patient presents with  . leg infection    right thigh started 1 week ago 03/04/18   HPI States it started like a boil and is now draining pus. He had been placing Neosporin and a dressing with minimal improvement. Hx of prior non-MRSA staph infections. Currently at [employer] in work-study with no health insurance.  Review of Systems     Objective:   Physical Exam  Constitutional: He appears well-developed and well-nourished. No distress.  Skin:  Right posterior thigh with oval appearing wound with overlying fibrinous tissue. No drainage on palpation but pus noticed in the dressing.       Assessment:    1. Carbuncle of leg, right - amoxicillin-clavulanate (AUGMENTIN) 875-125 MG tablet; Take 1 tablet by mouth 2 (two) times daily.  Dispense: 20 tablet; Refill: 0    Plan:    Discussed saline wet to dry dressings and bandage. Return if not improving over the next week.

## 2018-11-11 ENCOUNTER — Encounter: Payer: Self-pay | Admitting: Family Medicine

## 2018-11-11 ENCOUNTER — Other Ambulatory Visit: Payer: Self-pay

## 2018-11-11 ENCOUNTER — Ambulatory Visit (INDEPENDENT_AMBULATORY_CARE_PROVIDER_SITE_OTHER): Payer: Self-pay | Admitting: Family Medicine

## 2018-11-11 DIAGNOSIS — J309 Allergic rhinitis, unspecified: Secondary | ICD-10-CM

## 2018-11-11 DIAGNOSIS — H6981 Other specified disorders of Eustachian tube, right ear: Secondary | ICD-10-CM

## 2018-11-11 NOTE — Progress Notes (Signed)
Patient: Ryan Cooley Male    DOB: Apr 18, 1985   33 y.o.   MRN: [id number] Visit Date: 11/11/2018  Today's Provider: Dortha Kern, PA  Virtual Visit via Video Note  I connected with Ryan Cooley on 11/11/18 at 10:40 AM EDT by a video enabled telemedicine application and verified that I am speaking with the correct person using two identifiers.  Location: Patient: Home Provider: Office   I discussed the limitations of evaluation and management by telemedicine and the availability of in person appointments. The patient expressed understanding and agreed to proceed.   Chief Complaint  Patient presents with  . Otalgia  . Headache   Subjective:     HPI: This 33 year old male developed a pain in the right side of of his head along the jaw line to the neck that started 11-07-18. Described a sharp pain in the right ear and neck region with a tight sensation along the right temple. No chills, sore throat, loss of taste, body aches, cough or hearing loss. No documented fever. Some extra pain to eat or swallow. Uses Nasacort for allergic rhinitis, but, nothing else.Marland Kitchen  No past medical history on file. Patient Active Problem List   Diagnosis Date Noted  . Allergic rhinitis 03/04/2015   Past Surgical History:  Procedure Laterality Date  . NO PAST SURGERIES     Family History  Problem Relation Age of Onset  . Depression Mother   . Diabetes Father   . Healthy Brother   . Diabetes Maternal Grandmother   . Heart disease Maternal Grandmother   . Cancer Maternal Grandfather        lung  . Healthy Brother    No Known Allergies  Current Outpatient Medications:  .  amoxicillin-clavulanate (AUGMENTIN) 875-125 MG tablet, Take 1 tablet by mouth 2 (two) times daily., Disp: 20 tablet, Rfl: 0 .  Cholecalciferol (VITAMIN D3) 1000 units CAPS, Take 1 capsule by mouth daily., Disp: , Rfl:  .  ferrous sulfate 325 (65 FE) MG EC tablet, Take 325 mg by mouth 3 (three)  times daily with meals., Disp: , Rfl:  .  ibuprofen (ADVIL,MOTRIN) 600 MG tablet, Take 1 tablet (600 mg total) by mouth every 6 (six) hours as needed., Disp: 30 tablet, Rfl: 0 .  Multiple Vitamin (MULTI-VITAMINS) TABS, Take by mouth., Disp: , Rfl:  .  triamcinolone (NASACORT ALLERGY 24HR) 55 MCG/ACT AERO nasal inhaler, Place 2 sprays into the nose daily., Disp: , Rfl:   Review of Systems  Constitutional: Negative.   HENT: Positive for ear pain. Negative for congestion, ear discharge, hearing loss, postnasal drip, rhinorrhea and sore throat.   Respiratory: Negative.   Cardiovascular: Negative.   Gastrointestinal: Negative.     Social History   Tobacco Use  . Smoking status: Never Smoker  . Smokeless tobacco: Never Used  Substance Use Topics  . Alcohol use: No    Alcohol/week: 0.0 standard drinks     Objective:   There were no vitals taken for this visit. There were no vitals filed for this visit.  WDWN male in no apparent distress.  Head: Normocephalic, atraumatic. Neck: Supple, NROM Respiratory: No apparent distress Psych: Normal mood and affect     Assessment & Plan    1. Acute dysfunction of right eustachian tube Onset over the past 4-5 days without COVID symptoms. No cough, sore throat or hearing loss. Pain along the angle of the right jaw  and sharp with eating. No observed locking of jaw. Suspect eustachian dysfunction. Denies travel, altitude changes or swimming. May use Nasacort and add Mucinex with Claritin-D prn. Recheck if any new symptoms or fever. No COVID exposure known.  2. Allergic rhinitis, unspecified seasonality, unspecified trigger Usually uses Nasacort for sneezing/rhinorrhea from allergic rhinitis. May add antihistamine and expectorant. Recheck if new symptoms or no improvement in the next 5-7 days.  Follow Up Instructions:    I discussed the assessment and treatment plan with the patient. The patient was provided an opportunity to ask questions and  all were answered. The patient agreed with the plan and demonstrated an understanding of the instructions.   The patient was advised to call back or seek an in-person evaluation if the symptoms worsen or if the condition fails to improve as anticipated.  I provided 15 minutes of non-face-to-face time during this encounter.      Dortha Kern, PA  [facility]

## 2018-11-12 ENCOUNTER — Other Ambulatory Visit: Payer: Self-pay

## 2018-11-12 ENCOUNTER — Emergency Department
Admission: EM | Admit: 2018-11-12 | Discharge: 2018-11-12 | Disposition: A | Discharge status: Home / Self Care | Payer: Self-pay | Attending: Emergency Medicine | Admitting: Emergency Medicine

## 2018-11-12 ENCOUNTER — Encounter: Payer: Self-pay | Admitting: Emergency Medicine

## 2018-11-12 DIAGNOSIS — K0889 Other specified disorders of teeth and supporting structures: Secondary | ICD-10-CM

## 2018-11-12 DIAGNOSIS — M26621 Arthralgia of right temporomandibular joint: Secondary | ICD-10-CM | POA: Insufficient documentation

## 2018-11-12 DIAGNOSIS — Z79899 Other long term (current) drug therapy: Secondary | ICD-10-CM | POA: Insufficient documentation

## 2018-11-12 DIAGNOSIS — K029 Dental caries, unspecified: Secondary | ICD-10-CM | POA: Insufficient documentation

## 2018-11-12 MED ORDER — AMOXICILLIN 875 MG PO TABS: 875.0000 mg | ORAL_TABLET | Freq: Two times a day (BID) | ORAL | 0 refills | Status: DC

## 2018-11-12 MED ORDER — AMOXICILLIN 500 MG PO CAPS
1000.0000 mg | ORAL_CAPSULE | Freq: Once | ORAL | Status: AC
  Administered 2018-11-12: 1000 mg via ORAL
  Filled 2018-11-12: qty 2

## 2018-11-12 MED ORDER — PREDNISONE 10 MG PO TABS: 10.0000 mg | ORAL_TABLET | Freq: Every day | ORAL | 0 refills | Status: DC

## 2018-11-12 MED ORDER — PREDNISONE 20 MG PO TABS
60.0000 mg | ORAL_TABLET | Freq: Once | ORAL | Status: AC
  Administered 2018-11-12: 23:00:00 60 mg via ORAL
  Filled 2018-11-12: qty 3

## 2018-11-12 NOTE — Discharge Instructions (Signed)
Please take prednisone amoxicillin as described.  Please eat a soft diet such as Jell-O, bananas, bread, rice, pudding.  Follow-up with primary care provider in 1 week if no improvement.  Return to the ER for any fevers worsening symptoms or urgent changes in health.

## 2018-11-12 NOTE — ED Notes (Signed)
Patient denies right ear pain, but describes the pain starting in right temple and radiating in front of right ear to jaw. Patient states sometimes the pain is posterior to right ear. Patient states pain is not similar to previous ear infection in 2013.

## 2018-11-12 NOTE — ED Triage Notes (Signed)
Patient with complaint of right ear pain that started Monday that has become worse throughout the week.

## 2018-11-12 NOTE — ED Provider Notes (Signed)
[facility] EMERGENCY DEPARTMENT Provider Note   CSN: [id number] Arrival date & time: 11/12/18  2118     History   Chief Complaint Chief Complaint  Patient presents with  . Otalgia    HPI Ryan Cooley is a 33 y.o. male presents to the emergency department for evaluation of 5 to 6 days of right sided ear pain.  He points to the right TMJ.  Denies any rashes, fevers, cough congestion or runny nose.  No sore throat.  Has pain with chewing and opening his mouth.  Denies any ear drainage or ear trauma.  Has been taking ibuprofen with little relief.  Has had intermittent tooth pain along the right lower jaw, states his teeth are not in the best of condition.  He thinks the pain is coming from his teeth although he is not had any facial swelling or fevers.     HPI  History reviewed. No pertinent past medical history.  Patient Active Problem List   Diagnosis Date Noted  . Allergic rhinitis 03/04/2015    Past Surgical History:  Procedure Laterality Date  . NO PAST SURGERIES          Home Medications    Prior to Admission medications   Medication Sig Start Date End Date Taking? Authorizing Provider  Cholecalciferol (VITAMIN D3) 1000 units CAPS Take 1 capsule by mouth daily.    [provider]  ferrous sulfate 325 (65 FE) MG EC tablet Take 325 mg by mouth 3 (three) times daily with meals.    [provider]  ibuprofen (ADVIL,MOTRIN) 600 MG tablet Take 1 tablet (600 mg total) by mouth every 6 (six) hours as needed. 05/24/15   Sable Feil, PA-C  Multiple Vitamin (MULTI-VITAMINS) TABS Take by mouth.    [provider]  triamcinolone (NASACORT ALLERGY 24HR) 55 MCG/ACT AERO nasal inhaler Place 2 sprays into the nose daily.    [provider]    Family History Family History  Problem Relation Age of Onset  . Depression Mother   . Diabetes Father   . Healthy Brother   . Diabetes Maternal Grandmother   . Heart  disease Maternal Grandmother   . Cancer Maternal Grandfather        lung  . Healthy Brother     Social History Social History   Tobacco Use  . Smoking status: Never Smoker  . Smokeless tobacco: Never Used  Substance Use Topics  . Alcohol use: No    Alcohol/week: 0.0 standard drinks  . Drug use: No     Allergies   Patient has no known allergies.   Review of Systems Review of Systems  Constitutional: Negative for fever.  HENT: Negative for congestion, facial swelling, sinus pressure, sinus pain and trouble swallowing.   Eyes: Negative for photophobia.  Respiratory: Negative for cough.   Gastrointestinal: Negative for nausea and vomiting.  Skin: Negative for rash and wound.  Neurological: Negative for headaches.     Physical Exam Updated Vital Signs BP (!) 147/92   Pulse 73   Temp 98.8 F (37.1 C) (Oral)   Resp 18   Ht 5\' 4"  (1.626 m)   Wt 77.1 kg   SpO2 97%   BMI 29.18 kg/m   Physical Exam Constitutional:      Appearance: He is well-developed.  HENT:     Head: Normocephalic and atraumatic.     Comments: Bilateral TMs are normal.  No rashes noted.  Canals are normal.  He  is extremely tender along the right TMJ.  Oral exam shows dental caries with decay with no fractured teeth but no signs of abscess formation.  Cranial nerves II through XII are intact.  No facial swelling or signs of abscess formation. Eyes:     Conjunctiva/sclera: Conjunctivae normal.  Neck:     Musculoskeletal: Normal range of motion.  Cardiovascular:     Rate and Rhythm: Normal rate.  Pulmonary:     Effort: Pulmonary effort is normal. No respiratory distress.  Musculoskeletal: Normal range of motion.  Skin:    General: Skin is warm.     Findings: No rash.  Neurological:     Mental Status: He is alert and oriented to person, place, and time.  Psychiatric:        Behavior: Behavior normal.        Thought Content: Thought content normal.      ED Treatments / Results  Labs (all  labs ordered are listed, but only abnormal results are displayed) Labs Reviewed - No data to display  EKG None  Radiology No results found.  Procedures Procedures (including critical care time)  Medications Ordered in ED Medications  predniSONE (DELTASONE) tablet 60 mg (has no administration in time range)  amoxicillin (AMOXIL) capsule 1,000 mg (has no administration in time range)     Initial Impression / Assessment and Plan / ED Course  I have reviewed the triage vital signs and the nursing notes.  Pertinent labs & imaging results that were available during my care of the patient were reviewed by me and considered in my medical decision making (see chart for details).       33 year old male with a right sided facial pain, he feels as if it could be a dental infection, no abscess seen.  On exam he seems to be mostly tender along the right TMJ.  Will place on prednisone taper and have him eat a soft diet.  Will give amoxicillin as he does have some dental caries and decay present.  Vital signs are stable, afebrile.  No rashes or neurological deficits.  Vital signs are stable.  He will follow-up with PCP.  He understands signs symptoms return to ED for. Final Clinical Impressions(s) / ED Diagnoses   Final diagnoses:  Arthralgia of right temporomandibular joint  Pain, dental    ED Discharge Orders    None       Ronnette JuniperGaines, Thomas C, PA-C 11/12/18 2227    Shaune PollackIsaacs, Cameron, MD 11/18/18 [phone]

## 2018-11-17 ENCOUNTER — Ambulatory Visit (INDEPENDENT_AMBULATORY_CARE_PROVIDER_SITE_OTHER): Payer: Self-pay | Admitting: Family Medicine

## 2018-11-17 ENCOUNTER — Encounter: Payer: Self-pay | Admitting: Family Medicine

## 2018-11-17 ENCOUNTER — Other Ambulatory Visit: Payer: Self-pay

## 2018-11-17 VITALS — BP 114/62 | HR 71 | Temp 98.2°F | Wt 188.0 lb

## 2018-11-17 DIAGNOSIS — M26629 Arthralgia of temporomandibular joint, unspecified side: Secondary | ICD-10-CM

## 2018-11-17 MED ORDER — CYCLOBENZAPRINE HCL 5 MG PO TABS: 5.0000 mg | ORAL_TABLET | Freq: Three times a day (TID) | ORAL | 0 refills | Status: DC | PRN

## 2018-11-17 NOTE — Progress Notes (Signed)
[name]  MRN: [id number] DOB: [birth year]  Subjective:  HPI   The patient is Ryan Cooley 33 year old male who presents for follow up from an office and ER visit for his right ear/jaw pain.  He was diagnosed with acute dysfunction of right eustachian tube on 11/11/18.  He was instructed to use Nasacort and Mucinex with Claritin D as needed.  He was then diagnosed with arthralgia of right temporomandibular joint.  For this he was placed on Amoxicillin for some dental caries and decay that was noted present.  He was also treated with prednisone taper and instructed to follow up with PCP. The patient states that he began getting better but since this morning he said it is back to being significantly painful.  He states that eating cereal is especially painful.  Patient Active Problem List   Diagnosis Date Noted  . Allergic rhinitis 03/04/2015   No past medical history on file.  Past Surgical History:  Procedure Laterality Date  . NO PAST SURGERIES     Family History  Problem Relation Age of Onset  . Depression Mother   . Diabetes Father   . Healthy Brother   . Diabetes Maternal Grandmother   . Heart disease Maternal Grandmother   . Cancer Maternal Grandfather        lung  . Healthy Brother    Social History   Socioeconomic History  . Marital status: Single    Spouse name: Not on file  . Number of children: Not on file  . Years of education: Not on file  . Highest education level: Not on file  Occupational History  . Not on file  Social Needs  . Financial resource strain: Not on file  . Food insecurity    Worry: Not on file    Inability: Not on file  . Transportation needs    Medical: Not on file    Non-medical: Not on file  Tobacco Use  . Smoking status: Never Smoker  . Smokeless tobacco: Never Used  Substance and Sexual Activity  . Alcohol use: No    Alcohol/week: 0.0 standard drinks  . Drug use: No  . Sexual activity: Not on file  Lifestyle  . Physical activity     Days per week: Not on file    Minutes per session: Not on file  . Stress: Not on file  Relationships  . Social Musician on phone: Not on file    Gets together: Not on file    Attends religious service: Not on file    Active member of club or organization: Not on file    Attends meetings of clubs or organizations: Not on file    Relationship status: Not on file  . Intimate partner violence    Fear of current or ex partner: Not on file    Emotionally abused: Not on file    Physically abused: Not on file    Forced sexual activity: Not on file  Other Topics Concern  . Not on file  Social History Narrative  . Not on file    Outpatient Encounter Medications as of 11/17/2018  Medication Sig Note  . amoxicillin (AMOXIL) 875 MG tablet Take 1 tablet (875 mg total) by mouth 2 (two) times daily. X 10 days   . Cholecalciferol (VITAMIN D3) 1000 units CAPS Take 1 capsule by mouth daily.   . ferrous sulfate 325 (65 FE) MG EC tablet Take 325 mg by mouth 3 (  three) times daily with meals.   Marland Kitchen ibuprofen (ADVIL,MOTRIN) 600 MG tablet Take 1 tablet (600 mg total) by mouth every 6 (six) hours as needed.   . Multiple Vitamin (MULTI-VITAMINS) TABS Take by mouth. 03/04/2015: Received from: [facility]  . predniSONE (DELTASONE) 10 MG tablet Take 1 tablet (10 mg total) by mouth daily. 6,5,4,3,2,1 six day taper   . triamcinolone (NASACORT ALLERGY 24HR) 55 MCG/ACT AERO nasal inhaler Place 2 sprays into the nose daily.    No facility-administered encounter medications on file as of 11/17/2018.     No Known Allergies  Review of Systems  Constitutional: Negative for chills, fever and malaise/fatigue.  HENT: Positive for congestion. Negative for ear discharge, ear pain, sinus pain, sore throat and tinnitus.   Respiratory: Negative for cough and shortness of breath.   Musculoskeletal: Positive for joint pain.  Neurological: Positive for headaches.    Objective:  BP 114/62 (BP  Location: Right Arm, Patient Position: Sitting, Cuff Size: Normal)   Pulse 71   Temp 98.2 F (36.8 C) (Oral)   Wt 188 lb (85.3 kg)   SpO2 96%   BMI 32.27 kg/m  Wt Readings from Last 3 Encounters:  11/17/18 188 lb (85.3 kg)  11/12/18 170 lb (77.1 kg)  03/14/18 175 lb 3.2 oz (79.5 kg)   Physical Exam  Constitutional: He is oriented to person, place, and time and well-developed, well-nourished, and in no distress.  HENT:  Head: Normocephalic.  Right Ear: External ear normal.  Left Ear: External ear normal.  Nose: Nose normal.  Very tender to palpate or stress the right TM joint. No swelling. Heavy plaque build up on teeth. No specific tooth decay visible. No click, crepitus or locking of right TM joint.  Eyes: Conjunctivae are normal.  Neck: Neck supple.  Pulmonary/Chest: Effort normal.  Abdominal: Soft.  Musculoskeletal: Normal range of motion.  Neurological: He is alert and oriented to person, place, and time.  Skin: No rash noted.  Psychiatric: Mood, affect and judgment normal.    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 Improved for Ryan Cooley short time with use of Amoxicillin and Prednisone taper prescribed in the ER on 11-12-18. No known injury. Discomfort is intermittent but has recurred today. Should finish the prednisone and Amoxicillin, apply moist heat, switch to Ryan Cooley soft diet and given Flexeril for muscle spasm. If no better by Monday 11-21-18, will schedule PT for evaluation and treatment of TMJ syndrome. May need referral to oral surgeon if no better with this conservative regimen. Patient agrees with this plan. - cyclobenzaprine (FLEXERIL) 5 MG tablet; Take 1 tablet (5 mg total) by mouth 3 (three) times daily as needed for muscle spasms.  Dispense: 21 tablet; Refill: 0

## 2019-02-10 ENCOUNTER — Other Ambulatory Visit: Payer: Self-pay

## 2019-02-13 ENCOUNTER — Ambulatory Visit (INDEPENDENT_AMBULATORY_CARE_PROVIDER_SITE_OTHER): Payer: Self-pay | Admitting: Family Medicine

## 2019-02-13 ENCOUNTER — Encounter: Payer: Self-pay | Admitting: Family Medicine

## 2019-02-13 ENCOUNTER — Other Ambulatory Visit: Payer: Self-pay

## 2019-02-13 VITALS — BP 136/86 | HR 82 | Temp 96.8°F | Wt 197.0 lb

## 2019-02-13 DIAGNOSIS — M545 Low back pain: Secondary | ICD-10-CM

## 2019-02-13 DIAGNOSIS — G8929 Other chronic pain: Secondary | ICD-10-CM

## 2019-02-13 DIAGNOSIS — M722 Plantar fascial fibromatosis: Secondary | ICD-10-CM

## 2019-02-13 NOTE — Progress Notes (Signed)
Ryan Cooley  MRN: [id number] DOB: Feb 18, 1986  Subjective:  HPI   Back pain-the patient states he has had chronic back pain since 2014.  He is uncertain what is wrong with his back but states that due to this pain he is unable to work over time and would like to have a doctor note saying he shouldn't work overtime.   Foot pain-the patient states he has achilles tendonitis and bone spurs of feet.  He states that he takes Ibuprofen for the pain and states it does not help.  He states he takes 600 mg QID and gets no relief.    However, recently he has replaced 2 doses with Tylenol  Patient Active Problem List   Diagnosis Date Noted  . Allergic rhinitis 03/04/2015  . Colitis, enteritis, and gastroenteritis of presumed infectious origin 01/25/2015  . History of frequent ear infections in childhood 01/25/2015  . Infective otitis externa 01/25/2015  . Injury, other and unspecified, knee, leg, ankle, and foot 01/25/2015  . 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 Problems influencing health status 01/25/2015   Past Surgical History:  Procedure Laterality Date  . NO PAST SURGERIES    ' Family History  Problem Relation Age of Onset  . Depression Mother   . Diabetes Father   . Healthy Brother   . Diabetes Maternal Grandmother   . Heart disease Maternal Grandmother   . Cancer Maternal Grandfather        lung  . Healthy Brother    No past medical history on file.  Social History   Socioeconomic History  . Marital status: Single    Spouse name: Not on file  . Number of children: Not on file  . Years of education: Not on file  . Highest education level: Not on file  Occupational History  . Not on file  Social Needs  . Financial resource strain: Not on file  . Food insecurity    Worry: Not on file    Inability: Not on file  . Transportation needs    Medical: Not on file    Non-medical: Not on file  Tobacco Use  . Smoking status: Never  Smoker  . Smokeless tobacco: Never Used  Substance and Sexual Activity  . Alcohol use: No    Alcohol/week: 0.0 standard drinks  . Drug use: No  . Sexual activity: Not on file  Lifestyle  . Physical activity    Days per week: Not on file    Minutes per session: Not on file  . Stress: Not on file  Relationships  . Social Musician on phone: Not on file    Gets together: Not on file    Attends religious service: Not on file    Active member of club or organization: Not on file    Attends meetings of clubs or organizations: Not on file    Relationship status: Not on file  . Intimate partner violence    Fear of current or ex partner: Not on file    Emotionally abused: Not on file    Physically abused: Not on file    Forced sexual activity: Not on file  Other Topics Concern  . Not on file  Social History Narrative  . Not on file   Outpatient Encounter Medications as of 02/13/2019  Medication Sig Note  . Cholecalciferol (VITAMIN D3) 1000 units CAPS Take 1 capsule by mouth daily.   . ferrous sulfate  325 (65 FE) MG EC tablet Take 325 mg by mouth 3 (three) times daily with meals.   Marland Kitchen ibuprofen (ADVIL,MOTRIN) 600 MG tablet Take 1 tablet (600 mg total) by mouth every 6 (six) hours as needed.   . Multiple Vitamin (MULTI-VITAMINS) TABS Take by mouth. 03/04/2015: Received from: [facility]  . triamcinolone (NASACORT ALLERGY 24HR) 55 MCG/ACT AERO nasal inhaler Place 2 sprays into the nose daily.   . [DISCONTINUED] amoxicillin (AMOXIL) 875 MG tablet Take 1 tablet (875 mg total) by mouth 2 (two) times daily. X 10 days   . [DISCONTINUED] cyclobenzaprine (FLEXERIL) 5 MG tablet Take 1 tablet (5 mg total) by mouth 3 (three) times daily as needed for muscle spasms.   . [DISCONTINUED] predniSONE (DELTASONE) 10 MG tablet Take 1 tablet (10 mg total) by mouth daily. 6,5,4,3,2,1 six day taper    No facility-administered encounter medications on file as of 02/13/2019.    No  Known Allergies  Review of Systems  Constitutional: Negative for chills, diaphoresis, fever and malaise/fatigue.  HENT: Negative for congestion, ear pain, sinus pain and sore throat.   Respiratory: Negative for cough and shortness of breath.   Cardiovascular: Negative for chest pain.  Gastrointestinal: Negative for abdominal pain and diarrhea.  Neurological: Negative for headaches.    Objective:  BP 136/86 (BP Location: Right Arm, Patient Position: Sitting, Cuff Size: Normal)   Pulse 82   Temp (!) 96.8 F (36 C) (Oral)   Wt 197 lb (89.4 kg)   SpO2 98%   BMI 33.81 kg/m   Physical Exam  Constitutional: He is oriented to person, place, and time and well-developed, well-nourished, and in no distress.  HENT:  Head: Normocephalic.  Eyes: Conjunctivae are normal.  Neck: Neck supple.  Cardiovascular: Normal rate and regular rhythm.  Pulmonary/Chest: Effort normal and breath sounds normal.  Abdominal: Soft. Bowel sounds are normal.  Musculoskeletal: Normal range of motion.        General: No tenderness.  Neurological: He is alert and oriented to person, place, and time. He has normal reflexes.  Skin: No rash noted.  Psychiatric: Mood, affect and judgment normal.    Assessment and Plan :   1. Chronic midline low back pain without sciatica No specific injury known. 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 Back has been tight feeling and use of ice pack and exercises recommended by [facility] has not helped much. Tried to use his mother's Flexeril last night but found it caused too much drowsiness. 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 Apply moist heat and/or topical Salonpas or Aspercreme with Lidocaine prn.   2. Plantar  fascial fibromatosis Diagnosed by Dr. Alberteen Spindle (podiatrist) in 2015 with some Achille's tendinitis. Ibuprofen some help with discomfort but the repetitive lifting at this new job exacerbates this condition, also.

## 2019-02-17 ENCOUNTER — Other Ambulatory Visit: Payer: Self-pay

## 2019-02-17 ENCOUNTER — Ambulatory Visit (INDEPENDENT_AMBULATORY_CARE_PROVIDER_SITE_OTHER): Payer: Self-pay | Admitting: Family Medicine

## 2019-02-17 ENCOUNTER — Encounter: Payer: Self-pay | Admitting: Family Medicine

## 2019-02-17 DIAGNOSIS — K529 Noninfective gastroenteritis and colitis, unspecified: Secondary | ICD-10-CM

## 2019-02-17 MED ORDER — PROMETHAZINE HCL 25 MG PO TABS: 25.0000 mg | ORAL_TABLET | Freq: Three times a day (TID) | ORAL | 0 refills | Status: DC | PRN

## 2019-02-17 NOTE — Progress Notes (Signed)
Patient: Ryan Cooley Male    DOB: 1985-12-05   33 y.o.   MRN: [id number] Visit Date: 02/17/2019  Today's Provider: Dortha Kern, PA   No chief complaint on file.  Subjective:  Virtual Visit via Telephone Note  I connected with Ryan Cooley on 02/17/19 at  9:40 AM EST by telephone and verified that I am speaking with the correct person using two identifiers.   I discussed the limitations, risks, security and privacy concerns of performing an evaluation and management service by telephone and the availability of in person appointments. I also discussed with the patient that there may be a patient responsible charge related to this service. The patient expressed understanding and agreed to proceed.   History of Present Illness:  This 33 year old male developed nausea and vomited twice at 3:30 am today. Since that time he has developed some abdominal pains with diarrhea. Tried Pepto-Bismol with Pepcid but lost it when he vomited the second time. Able to keep fluids down since that time and last episode of diarrhea was and hour ago. No documented fever but some low back discomfort with this. No respiratory symptoms. States he has had a history of a kidney stone many years ago but this is not as severe as that was.    Observations/Objective: No past medical history on file.  Past Surgical History:  Procedure Laterality Date  . NO PAST SURGERIES     Family History  Problem Relation Age of Onset  . Depression Mother   . Diabetes Father   . Healthy Brother   . Diabetes Maternal Grandmother   . Heart disease Maternal Grandmother   . Cancer Maternal Grandfather        lung  . Healthy Brother    No Known Allergies  Current Outpatient Medications:  .  Cholecalciferol (VITAMIN D3) 1000 units CAPS, Take 1 capsule by mouth daily., Disp: , Rfl:  .  ferrous sulfate 325 (65 FE) MG EC tablet, Take 325 mg by mouth 3 (three) times daily with meals., Disp: , Rfl:  .   ibuprofen (ADVIL,MOTRIN) 600 MG tablet, Take 1 tablet (600 mg total) by mouth every 6 (six) hours as needed., Disp: 30 tablet, Rfl: 0 .  Multiple Vitamin (MULTI-VITAMINS) TABS, Take by mouth., Disp: , Rfl:  .  triamcinolone (NASACORT ALLERGY 24HR) 55 MCG/ACT AERO nasal inhaler, Place 2 sprays into the nose daily., Disp: , Rfl:   Review of Systems  Constitutional: Positive for appetite change. Negative for activity change and fever.  Respiratory: Negative for cough and shortness of breath.   Gastrointestinal: Positive for abdominal pain, diarrhea, nausea and vomiting. Negative for blood in stool.  Neurological: Positive for headaches. Negative for dizziness.  Psychiatric/Behavioral: Negative for self-injury and sleep disturbance. The patient is not nervous/anxious.    Social History   Tobacco Use  . Smoking status: Never Smoker  . Smokeless tobacco: Never Used  Substance Use Topics  . Alcohol use: No    Alcohol/week: 0.0 standard drinks     Objective:   There were no vitals taken for this visit. There were no vitals filed for this visit.There is no height or weight on file to calculate BMI.  Physical Exam: No apparent respiratory distress during telephonic interview.    Assessment & Plan     1. Gastroenteritis Onset with nausea, vomiting and diarrhea at 3:30 am today. No one else in the household has been ill and they all ate the  same meal last evening. Recommend liquid diet with slow change to soft bland diet once nausea and vomiting controlled. Will give Promethazine for N&V and may use Imodium or Pepto-Bismol for diarrhea. May need to go to the ER or [facility] if no better in 12-24- hours to assess for dehydration. Patient agrees with plan. - promethazine (PHENERGAN) 25 MG tablet; Take 1 tablet (25 mg total) by mouth every 8 (eight) hours as needed for nausea or vomiting.  Dispense: 20 tablet; Refill: 0    Follow Up Instructions:  I discussed the assessment and treatment  plan with the patient. The patient was provided an opportunity to ask questions and all were answered. The patient agreed with the plan and demonstrated an understanding of the instructions.   The patient was advised to call back or seek an in-person evaluation if the symptoms worsen or if the condition fails to improve as anticipated.  I provided 16 minutes of non-face-to-face time during this encounter.     Dortha Kern, PA  [facility]

## 2019-07-12 ENCOUNTER — Telehealth: Payer: Self-pay

## 2019-07-12 NOTE — Telephone Encounter (Signed)
Virtual due to possible COVID.

## 2019-07-12 NOTE — Telephone Encounter (Signed)
Copied from CRM [id number]. Topic: General - Inquiry >> Jul 12, 2019  2:43 PM Deborha Payment wrote: Reason for CRM: Patient is requesting an in person visit with PCP.  Patient states that stomach is upset with a  sore throat.  Patient states he is having a chalky taste in his mouth.  Let patient know we could not see him in office due to sore throat, patient wanted to see what PCP would call him and see if they can talk and if PCP can approve patient coming into the office   Patient call back [phone]

## 2019-07-12 NOTE — Telephone Encounter (Signed)
Returned patients call and no answer, left voice message for patient to return call if patient calls back ok for PEC to advise patient he has to due a virtual visit.

## 2019-07-13 ENCOUNTER — Telehealth: Payer: Self-pay | Admitting: Physician Assistant

## 2019-07-13 NOTE — Telephone Encounter (Addendum)
Returned call to pt. Advised of recommendation to do a Virtual Visit.  The pt. stated "That would be of zero use to me; I need someone to look in my throat.  It feels like I have something stuck in there, and it hurts to swallow."  Acknowledged pt's concern, and advised that due to COVID, it is our policy to not bring patients into the office, if they have any symptoms that could be COVID related.  Suggested that the pt. Could go to an Urgent Care to be examined.  After several seconds of silence the pt. eBay" and ended the call.  Will forward note to PCP to make aware of pt's response.

## 2019-07-13 NOTE — Telephone Encounter (Signed)
Noted, thanks!

## 2019-07-13 NOTE — Telephone Encounter (Signed)
-----   Message from [facility], [place] sent at 07/13/2019  9:00 AM EDT ----- Regarding: FYI Returned call to pt. Advised of recommendation to do a Virtual Visit.  The pt. stated "That would be of zero use to me; I need someone to look in my throat.  It feels like I have something stuck in there, and it hurts to swallow."  Acknowledged pt's concern, and advised that due to COVID, it is our policy to not bring patients into the office, if they have any symptoms that could be COVID related.  Suggested that the pt. Could go to an Urgent Care to be examined.  After several seconds of silence the pt. [employer]" and ended the call.  Will forward note to PCP to make aware of pt's response.

## 2019-07-13 NOTE — Telephone Encounter (Signed)
Ryan Cooley was advised via another message.

## 2019-07-14 ENCOUNTER — Other Ambulatory Visit: Payer: Self-pay

## 2019-07-14 ENCOUNTER — Emergency Department
Admission: EM | Admit: 2019-07-14 | Discharge: 2019-07-14 | Disposition: A | Discharge status: Home / Self Care | Payer: Self-pay | Attending: Emergency Medicine | Admitting: Emergency Medicine

## 2019-07-14 ENCOUNTER — Emergency Department: Payer: Self-pay

## 2019-07-14 DIAGNOSIS — Z79899 Other long term (current) drug therapy: Secondary | ICD-10-CM | POA: Insufficient documentation

## 2019-07-14 DIAGNOSIS — R6883 Chills (without fever): Secondary | ICD-10-CM | POA: Insufficient documentation

## 2019-07-14 DIAGNOSIS — Z20822 Contact with and (suspected) exposure to covid-19: Secondary | ICD-10-CM | POA: Insufficient documentation

## 2019-07-14 DIAGNOSIS — K120 Recurrent oral aphthae: Secondary | ICD-10-CM | POA: Insufficient documentation

## 2019-07-14 LAB — GROUP A STREP BY PCR: Group A Strep by PCR: NOT DETECTED

## 2019-07-14 MED ORDER — METHYLPREDNISOLONE 4 MG PO TBPK: ORAL_TABLET | ORAL | 0 refills | Status: DC

## 2019-07-14 MED ORDER — LIDOCAINE VISCOUS HCL 2 % MT SOLN
15.0000 mL | Freq: Once | OROMUCOSAL | Status: AC
  Administered 2019-07-14: 15 mL via ORAL
  Filled 2019-07-14: qty 15

## 2019-07-14 MED ORDER — LIDOCAINE VISCOUS HCL 2 % MT SOLN: 5.0000 mL | Freq: Four times a day (QID) | OROMUCOSAL | 0 refills | Status: DC | PRN

## 2019-07-14 MED ORDER — DIPHENHYDRAMINE HCL 12.5 MG/5ML PO ELIX
12.5000 mg | ORAL_SOLUTION | Freq: Once | ORAL | Status: AC
  Administered 2019-07-14: 12.5 mg via ORAL
  Filled 2019-07-14: qty 5

## 2019-07-14 MED ORDER — PSEUDOEPH-BROMPHEN-DM 30-2-10 MG/5ML PO SYRP: 5.0000 mL | ORAL_SOLUTION | Freq: Four times a day (QID) | ORAL | 0 refills | Status: DC | PRN

## 2019-07-14 NOTE — ED Triage Notes (Signed)
FIRST NURSE NOTE- here for dysphagia.  Reports breathing ok but has had difficulty getting things down.  NAD. Unlabored. Ambulatory.

## 2019-07-14 NOTE — ED Triage Notes (Addendum)
Pt here via POV from home with c/o sore throat and chills.  Pt states he feels like something is trying to come up.  Pt states taking some medications with some relief.   PCP stated to pt that they felt he may have more symptoms of covid. Pt states he needs someone to look into throat.  Pt states a minor headache

## 2019-07-14 NOTE — ED Notes (Signed)
See triage note  Presents stating that he feels like something is stuck in throat for the past 3 days  States he is having some pain with swallowing  Unsure of fever at home but low grade noted on arrival

## 2019-07-14 NOTE — ED Provider Notes (Signed)
[facility] Emergency Department Provider Note   ____________________________________________   First MD Initiated Contact with Patient 07/14/19 1133     (approximate)  I have reviewed the triage vital signs and the nursing notes.   HISTORY  Chief Complaint Sore Throat and Chills    HPI Ryan Cooley is a 34 y.o. male patient complains of 4 days of sore throat, chills, body aches and minor headache.  Patient state able to tolerate fluids and soft food with moderate difficulty.  Patient denies recent travel or known contact with COVID-19.  Patient denies other URI signs and symptoms.  Patient denies nausea, vomiting, diarrhea.  No palliative measure for complaint.  Patient has history of oral aphthae.         History reviewed. No pertinent past medical history.  Patient Active Problem List   Diagnosis Date Noted  . Allergic rhinitis 03/04/2015  . Colitis, enteritis, and gastroenteritis of presumed infectious origin 01/25/2015  . History of frequent ear infections in childhood 01/25/2015  . Infective otitis externa 01/25/2015  . Injury, other and unspecified, knee, leg, ankle, and foot 01/25/2015  . 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 Problems influencing health status 01/25/2015    Past Surgical History:  Procedure Laterality Date  . NO PAST SURGERIES      Prior to Admission medications   Medication Sig Start Date End Date Taking? Authorizing Provider  brompheniramine-pseudoephedrine-DM 30-2-10 MG/5ML syrup Take 5 mLs by mouth 4 (four) times daily as needed. Mix with 5 mL of viscous lidocaine for swish and swallow. 07/14/19   Joni Reining, PA-C  Cholecalciferol (VITAMIN D3) 1000 units CAPS Take 1 capsule by mouth daily.    [provider]  ferrous sulfate 325 (65 FE) MG EC tablet Take 325 mg by mouth 3 (three) times daily with meals.    [provider]  lidocaine (XYLOCAINE) 2 % solution  Use as directed 5 mLs in the mouth or throat every 6 (six) hours as needed for mouth pain. Mix with 5 mL of Bromfed-DM for swish and swallow 07/14/19   Joni Reining, PA-C  methylPREDNISolone (MEDROL DOSEPAK) 4 MG TBPK tablet Take Tapered dose as directed 07/14/19   Joni Reining, PA-C  Multiple Vitamin (MULTI-VITAMINS) TABS Take by mouth.    [provider]  promethazine (PHENERGAN) 25 MG tablet Take 1 tablet (25 mg total) by mouth every 8 (eight) hours as needed for nausea or vomiting. 02/17/19   Chrismon, Jodell Cipro, PA  triamcinolone (NASACORT ALLERGY 24HR) 55 MCG/ACT AERO nasal inhaler Place 2 sprays into the nose daily.    [provider]    Allergies Patient has no known allergies.  Family History  Problem Relation Age of Onset  . Depression Mother   . Diabetes Father   . Healthy Brother   . Diabetes Maternal Grandmother   . Heart disease Maternal Grandmother   . Cancer Maternal Grandfather        lung  . Healthy Brother     Social History Social History   Tobacco Use  . Smoking status: Never Smoker  . Smokeless tobacco: Never Used  Substance Use Topics  . Alcohol use: No    Alcohol/week: 0.0 standard drinks  . Drug use: No    Review of Systems Constitutional: Fever and body aches. Eyes: No visual changes. ENT: Sore throat Cardiovascular: Denies chest pain. Respiratory: Denies shortness of breath. Gastrointestinal: No abdominal pain.  No nausea, no  vomiting.  No diarrhea.  No constipation. Genitourinary: Negative for dysuria. Musculoskeletal: Negative for back pain. Skin: Negative for rash. Neurological: Negative for headaches, focal weakness or numbness.   ____________________________________________   PHYSICAL EXAM:  VITAL SIGNS: ED Triage Vitals  Enc Vitals Group     BP 07/14/19 1128 116/80     Pulse Rate 07/14/19 1128 (!) 117     Resp 07/14/19 1128 18     Temp 07/14/19 1128 99.3 F (37.4 C)     Temp src --      SpO2 07/14/19 1128  96 %     Weight 07/14/19 1127 180 lb (81.6 kg)     Height 07/14/19 1127 5\' 5"  (1.651 m)     Head Circumference --      Peak Flow --      Pain Score 07/14/19 1127 0     Pain Loc --      Pain Edu? --      Excl. in GC? --    Constitutional: Alert and oriented. Well appearing and in no acute distress. Mouth/Throat: Mucous membranes are moist.  Oropharynx erythematous.  Multiple oval lesions. Neck: No stridor.   Hematological/Lymphatic/Immunilogical: No cervical lymphadenopathy. Cardiovascular: Normal rate, regular rhythm. Grossly normal heart sounds.  Good peripheral circulation. Respiratory: Normal respiratory effort.  No retractions. Lungs CTAB. Gastrointestinal: Soft and nontender. No distention. No abdominal bruits. No CVA tenderness. Neurologic:  Normal speech and language. No gross focal neurologic deficits are appreciated. No gait instability. Skin:  Skin is warm, dry and intact. No rash noted. Psychiatric: Mood and affect are normal. Speech and behavior are normal.  ____________________________________________   LABS (all labs ordered are listed, but only abnormal results are displayed)  Labs Reviewed  GROUP A STREP BY PCR  SARS CORONAVIRUS 2 (TAT 6-24 HRS)   ____________________________________________  EKG   ____________________________________________  RADIOLOGY  ED MD interpretation:    Official radiology report(s): DG Chest Portable 1 View  Result Date: 07/14/2019 CLINICAL DATA:  Cough, fatigue EXAM: PORTABLE CHEST 1 VIEW COMPARISON:  None. FINDINGS: The heart size and mediastinal contours are within normal limits. No focal airspace consolidation, pleural effusion, or pneumothorax. The visualized skeletal structures are unremarkable. IMPRESSION: No active disease. Electronically Signed   By: 09/13/2019 D.O.   On: 07/14/2019 12:11    ____________________________________________   PROCEDURES  Procedure(s) performed (including Critical  Care):  Procedures   ____________________________________________   INITIAL IMPRESSION / ASSESSMENT AND PLAN / ED COURSE  As part of my medical decision making, I reviewed the following data within the electronic MEDICAL RECORD NUMBER     Patient presents with 3 to 4 days of sore throat and mild dysphagia.  Patient also complained of fever and fatigue.  Discussed negative strep results with patient.  Physical exam is consistent with aphthous ulcers.  Patient advised self quarantine pending results of COVID-19 test.  If test is positive must quarantine additional 10 days.    Ryan Cooley was evaluated in Emergency Department on 07/14/2019 for the symptoms described in the history of present illness. He was evaluated in the context of the global COVID-19 pandemic, which necessitated consideration that the patient might be at risk for infection with the SARS-CoV-2 virus that causes COVID-19. Institutional protocols and algorithms that pertain to the evaluation of patients at risk for COVID-19 are in a state of rapid change based on information released by regulatory bodies including the CDC and federal and state organizations. These policies and algorithms were followed  during the patient's care in the ED.       ____________________________________________   FINAL CLINICAL IMPRESSION(S) / ED DIAGNOSES  Final diagnoses:  Aphthous ulcer of pharynx or hypopharynx     ED Discharge Orders         Ordered    lidocaine (XYLOCAINE) 2 % solution  Every 6 hours PRN     07/14/19 1353    brompheniramine-pseudoephedrine-DM 30-2-10 MG/5ML syrup  4 times daily PRN     07/14/19 1353    methylPREDNISolone (MEDROL DOSEPAK) 4 MG TBPK tablet     07/14/19 1353           Note:  This document was prepared using Dragon voice recognition software and may include unintentional dictation errors.    Sable Feil, PA-C 07/14/19 1356    Vanessa S[place]oke, MD 07/15/19 [phone]

## 2019-07-14 NOTE — Discharge Instructions (Signed)
Follow discharge care instruction take medication as directed.  Advised self quarantine pending results of COVID-19 test.  Results can be found in the MyChart app between 24 and 36 hours.  You will be notified if test is positive.

## 2019-07-15 LAB — SARS CORONAVIRUS 2 (TAT 6-24 HRS): SARS Coronavirus 2: NEGATIVE

## 2020-04-30 ENCOUNTER — Other Ambulatory Visit: Payer: Self-pay

## 2020-04-30 ENCOUNTER — Encounter: Payer: Self-pay | Admitting: Family Medicine

## 2020-04-30 NOTE — Progress Notes (Deleted)
MyChart Video Visit    Virtual Visit via Video Note   This visit type was conducted due to national recommendations for restrictions regarding the COVID-19 Pandemic (e.g. social distancing) in an effort to limit this patient's exposure and mitigate transmission in our community. This patient is at least at moderate risk for complications without adequate follow up. This format is felt to be most appropriate for this patient at this time. Physical exam was limited by quality of the video and audio technology used for the visit.   Patient location: *** Provider location: ***  I discussed the limitations of evaluation and management by telemedicine and the availability of in person appointments. The patient expressed understanding and agreed to proceed.  Patient: Ryan Cooley   DOB: [birth year]   35 y.o. Male  MRN: [id number] Visit Date: 04/30/2020  Today's healthcare provider: Megan Mans, MD   No chief complaint on file.  Subjective    HPI  Discuss ADHD  The patient was last seen 1 years ago. Changes made at last visit include no medication changes. He feels that condition is Worse. He is having side effects. Patient reports that he is having difficulty concentrating.     {Show patient history (optional):23778::" "}  Medications: Outpatient Medications Prior to Visit  Medication Sig  . brompheniramine-pseudoephedrine-DM 30-2-10 MG/5ML syrup Take 5 mLs by mouth 4 (four) times daily as needed. Mix with 5 mL of viscous lidocaine for swish and swallow.  . Cholecalciferol (VITAMIN D3) 1000 units CAPS Take 1 capsule by mouth daily.  . ferrous sulfate 325 (65 FE) MG EC tablet Take 325 mg by mouth 3 (three) times daily with meals.  . lidocaine (XYLOCAINE) 2 % solution Use as directed 5 mLs in the mouth or throat every 6 (six) hours as needed for mouth pain. Mix with 5 mL of Bromfed-DM for swish and swallow  . methylPREDNISolone (MEDROL DOSEPAK) 4 MG TBPK tablet Take  Tapered dose as directed  . Multiple Vitamin (MULTI-VITAMINS) TABS Take by mouth.  . promethazine (PHENERGAN) 25 MG tablet Take 1 tablet (25 mg total) by mouth every 8 (eight) hours as needed for nausea or vomiting.  . triamcinolone (NASACORT ALLERGY 24HR) 55 MCG/ACT AERO nasal inhaler Place 2 sprays into the nose daily.   No facility-administered medications prior to visit.    Review of Systems  {Labs  Heme  Chem  Endocrine  Serology  Results Review (optional):23779::" "}  Objective    There were no vitals taken for this visit. {Show previous vital signs (optional):23777::" "}  Physical Exam     Assessment & Plan     ***  No follow-ups on file.     I discussed the assessment and treatment plan with the patient. The patient was provided an opportunity to ask questions and all were answered. The patient agreed with the plan and demonstrated an understanding of the instructions.   The patient was advised to call back or seek an in-person evaluation if the symptoms worsen or if the condition fails to improve as anticipated.  I provided *** minutes of non-face-to-face time during this encounter.  {provider attestation***:1}  Megan Mans, MD [facility] [phone] (phone) [phone] (fax)  [facility]

## 2020-05-07 ENCOUNTER — Telehealth (INDEPENDENT_AMBULATORY_CARE_PROVIDER_SITE_OTHER): Payer: Self-pay | Admitting: Family Medicine

## 2020-05-07 DIAGNOSIS — F900 Attention-deficit hyperactivity disorder, predominantly inattentive type: Secondary | ICD-10-CM

## 2020-05-07 DIAGNOSIS — F331 Major depressive disorder, recurrent, moderate: Secondary | ICD-10-CM

## 2020-05-07 MED ORDER — SERTRALINE HCL 50 MG PO TABS: 50.0000 mg | ORAL_TABLET | Freq: Every day | ORAL | 3 refills | Status: DC

## 2020-05-07 NOTE — Progress Notes (Signed)
Virtual Visit via Telephone Note  I connected with Wilburt Messina Vankirk on 05/07/20 at  4:00 PM EST by telephone and verified that I am speaking with the correct person using two identifiers.  Location: Patient: Home Provider: Office   I discussed the limitations, risks, security and privacy concerns of performing an evaluation and management service by telephone and the availability of in person appointments. I also discussed with the patient that there may be a patient responsible charge related to this service. The patient expressed understanding and agreed to proceed.   History of Present Illness: Patient calls in today asking for old records regarding his ADD.  In the remote past he was treated for this.  Nothing recently but he wishes to have this as he is applying for disability. He states the reason is applying for disability is chronic pain. On further discussion with patient he has anhedonia and mild depressed mood.  He is not suicidal and is not looking forward to things.  He is willing to pursue treatment.   Observations/Objective:   Assessment and Plan: 1. Attention deficit hyperactivity disorder (ADHD), predominantly inattentive type Obtain old records  2. Moderate episode of recurrent major depressive disorder (HCC) Try sertraline 50 mg daily.  See the patient back in 1 month.  PHQ-9 on next visit.   Follow Up Instructions:    I discussed the assessment and treatment plan with the patient. The patient was provided an opportunity to ask questions and all were answered. The patient agreed with the plan and demonstrated an understanding of the instructions.   The patient was advised to call back or seek an in-person evaluation if the symptoms worsen or if the condition fails to improve as anticipated.  I provided 12 minutes of non-face-to-face time during this encounter.   Megan Mans, MD

## 2020-05-19 ENCOUNTER — Encounter: Payer: Self-pay | Admitting: Emergency Medicine

## 2020-05-19 ENCOUNTER — Other Ambulatory Visit: Payer: Self-pay

## 2020-05-19 ENCOUNTER — Emergency Department
Admission: EM | Admit: 2020-05-19 | Discharge: 2020-05-19 | Disposition: A | Discharge status: Home / Self Care | Payer: Self-pay | Attending: Emergency Medicine | Admitting: Emergency Medicine

## 2020-05-19 ENCOUNTER — Emergency Department: Payer: Self-pay

## 2020-05-19 DIAGNOSIS — R0789 Other chest pain: Secondary | ICD-10-CM | POA: Insufficient documentation

## 2020-05-19 LAB — BASIC METABOLIC PANEL
Anion gap: 11 (ref 5–15)
BUN: 13 mg/dL (ref 6–20)
CO2: 23 mmol/L (ref 22–32)
Calcium: 8.8 mg/dL — ABNORMAL LOW (ref 8.9–10.3)
Chloride: 102 mmol/L (ref 98–111)
Creatinine, Ser: 0.96 mg/dL (ref 0.61–1.24)
GFR, Estimated: >60 mL/min (ref >60)
Glucose, Bld: 107 mg/dL — ABNORMAL HIGH (ref 70–99)
Potassium: 3.5 mmol/L (ref 3.5–5.1)
Sodium: 136 mmol/L (ref 135–145)

## 2020-05-19 LAB — CBC
HCT: 41.7 % (ref 39.0–52.0)
Hemoglobin: 15.4 g/dL (ref 13.0–17.0)
MCH: 30.7 pg (ref 26.0–34.0)
MCHC: 36.9 g/dL — ABNORMAL HIGH (ref 30.0–36.0)
MCV: 83.2 fL (ref 80.0–100.0)
Platelets: 285 10*3/uL (ref 150–400)
RBC: 5.01 MIL/uL (ref 4.22–5.81)
RDW: 11.9 % (ref 11.5–15.5)
WBC: 5.9 10*3/uL (ref 4.0–10.5)
nRBC: 0 % (ref 0.0–0.2)

## 2020-05-19 LAB — TROPONIN I (HIGH SENSITIVITY): Troponin I (High Sensitivity): 2 ng/L (ref <18)

## 2020-05-19 MED ORDER — ALUM & MAG HYDROXIDE-SIMETH 200-200-20 MG/5ML PO SUSP
30.0000 mL | Freq: Once | ORAL | Status: AC
  Administered 2020-05-19: 30 mL via ORAL
  Filled 2020-05-19: qty 30

## 2020-05-19 NOTE — ED Triage Notes (Signed)
Patient with complaint of intermittent central chest pain and feeling like his heart was racing over the past couple of days. Patient states that it became worse today. Patient states that he started taking a new depression medication on Wednesday and he thinks it maybe from that.

## 2020-05-19 NOTE — ED Provider Notes (Signed)
[facility] Emergency Department Provider Note   ____________________________________________    I have reviewed the triage vital signs and the nursing notes.   HISTORY  Chief Complaint Chest Pain     HPI Ryan Cooley is a 35 y.o. male who presents with complaints of chest discomfort.  Patient describes inferior central chest discomfort which started earlier this morning.  At times he felt like his heart was beating harder than normal.  He reports he recently started taking sertraline 4 days ago and decided to switch from taking it at night to taking in the morning, he first took it in the morning today.  He thinks this may be related to his symptoms.  No history of heart disease.  No cough or shortness of breath.  No calf pain or swelling.  No fevers chills nausea vomiting or diaphoresis.  History reviewed. No pertinent past medical history.  Patient Active Problem List   Diagnosis Date Noted  . Allergic rhinitis 03/04/2015  . Colitis, enteritis, and gastroenteritis of presumed infectious origin 01/25/2015  . History of frequent ear infections in childhood 01/25/2015  . Infective otitis externa 01/25/2015  . Injury, other and unspecified, knee, leg, ankle, and foot 01/25/2015  . 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 Problems influencing health status 01/25/2015    Past Surgical History:  Procedure Laterality Date  . NO PAST SURGERIES      Prior to Admission medications   Medication Sig Start Date End Date Taking? Authorizing Provider  brompheniramine-pseudoephedrine-DM 30-2-10 MG/5ML syrup Take 5 mLs by mouth 4 (four) times daily as needed. Mix with 5 mL of viscous lidocaine for swish and swallow. Patient not taking: Reported on 04/30/2020 07/14/19   Joni Reining, PA-C  Cholecalciferol (VITAMIN D3) 1000 units CAPS Take 1 capsule by mouth daily. Patient not taking: Reported on 04/30/2020    [provider]  ferrous sulfate 325 (65 FE) MG EC tablet Take 325 mg by mouth 3 (three) times daily with meals. Patient not taking: Reported on 04/30/2020    [provider]  lidocaine (XYLOCAINE) 2 % solution Use as directed 5 mLs in the mouth or throat every 6 (six) hours as needed for mouth pain. Mix with 5 mL of Bromfed-DM for swish and swallow Patient not taking: Reported on 04/30/2020 07/14/19   Joni Reining, PA-C  methylPREDNISolone (MEDROL DOSEPAK) 4 MG TBPK tablet Take Tapered dose as directed 07/14/19   Joni Reining, PA-C  Multiple Vitamin (MULTI-VITAMINS) TABS Take by mouth.    [provider]  promethazine (PHENERGAN) 25 MG tablet Take 1 tablet (25 mg total) by mouth every 8 (eight) hours as needed for nausea or vomiting. 02/17/19   Chrismon, Jodell Cipro, PA-C  sertraline (ZOLOFT) 50 MG tablet Take 1 tablet (50 mg total) by mouth daily. 05/07/20   Maple Hudson., MD  triamcinolone (NASACORT) 55 MCG/ACT AERO nasal inhaler Place 2 sprays into the nose daily.    [provider]     Allergies Patient has no known allergies.  Family History  Problem Relation Age of Onset  . Depression Mother   . Diabetes Father   . Healthy Brother   . Diabetes Maternal Grandmother   . Heart disease Maternal Grandmother   . Cancer Maternal Grandfather        lung  . Healthy Brother     Social History Social History   Tobacco Use  . Smoking status:  Never Smoker  . Smokeless tobacco: Never Used  Substance Use Topics  . Alcohol use: No    Alcohol/week: 0.0 standard drinks  . Drug use: No    Review of Systems  Constitutional: No fever/chills Eyes: No visual changes.  ENT: No sore throat. Cardiovascular: As above Respiratory: Denies shortness of breath. Gastrointestinal: No abdominal pain.  No nausea, no vomiting.   Genitourinary: Negative for dysuria. Musculoskeletal: Negative for back pain. Skin: Negative for rash. Neurological: Negative for headaches  or weakness   ____________________________________________   PHYSICAL EXAM:  VITAL SIGNS: ED Triage Vitals  Enc Vitals Group     BP 05/19/20 2018 132/79     Pulse Rate 05/19/20 2018 94     Resp 05/19/20 2018 18     Temp 05/19/20 2018 97.7 F (36.5 C)     Temp Source 05/19/20 2018 Oral     SpO2 05/19/20 2018 96 %     Weight 05/19/20 2019 83.9 kg (185 lb)     Height 05/19/20 2019 1.651 m (5\' 5" )     Head Circumference --      Peak Flow --      Pain Score 05/19/20 2019 4     Pain Loc --      Pain Edu? --      Excl. in GC? --     Constitutional: Alert and oriented.   Nose: No congestion/rhinnorhea. Mouth/Throat: Mucous membranes are moist.    Cardiovascular: Normal rate, regular rhythm. Grossly normal heart sounds.  Good peripheral circulation. Respiratory: Normal respiratory effort.  No retractions. Lungs CTAB. Gastrointestinal: Soft and nontender. No distention.  No CVA tenderness.  Musculoskeletal: No lower extremity tenderness nor edema.  Warm and well perfused Neurologic:  Normal speech and language. No gross focal neurologic deficits are appreciated.  Skin:  Skin is warm, dry and intact. No rash noted. Psychiatric: Mood and affect are normal. Speech and behavior are normal.  ____________________________________________   LABS (all labs ordered are listed, but only abnormal results are displayed)  Labs Reviewed  BASIC METABOLIC PANEL - Abnormal; Notable for the following components:      Result Value   Glucose, Bld 107 (*)    Calcium 8.8 (*)    All other components within normal limits  CBC - Abnormal; Notable for the following components:   MCHC 36.9 (*)    All other components within normal limits  TROPONIN I (HIGH SENSITIVITY)   ____________________________________________  EKG  ED ECG REPORT I, 07/17/20, the attending physician, personally viewed and interpreted this ECG.  Date: 05/19/2020  Rhythm: normal sinus rhythm QRS Axis:  normal Intervals: normal ST/T Wave abnormalities: normal Narrative Interpretation: no evidence of acute ischemia  ____________________________________________  RADIOLOGY  Chest x-ray viewed by me, no acute abnormality ____________________________________________   PROCEDURES  Procedure(s) performed: No  Procedures   Critical Care performed: No ____________________________________________   INITIAL IMPRESSION / ASSESSMENT AND PLAN / ED COURSE  Pertinent labs & imaging results that were available during my care of the patient were reviewed by me and considered in my medical decision making (see chart for details).  Patient presents with chest discomfort as described above.  Differential includes GERD/gastritis, medication reaction, less likely ACS, not consistent with myocarditis pericarditis dissection or PE.  High sensitive troponin is normal, lab work is otherwise reassuring, chest x-ray is normal.  With GI cocktail with significant improvement, appropriate discharge at this time with outpatient follow-up as needed, return precautions discussed.    ____________________________________________  FINAL CLINICAL IMPRESSION(S) / ED DIAGNOSES  Final diagnoses:  Atypical chest pain        Note:  This document was prepared using Dragon voice recognition software and may include unintentional dictation errors.   Jene Every, MD 05/19/20 2229

## 2020-05-20 ENCOUNTER — Telehealth: Payer: Self-pay

## 2020-05-20 NOTE — Telephone Encounter (Signed)
Patient reports that he has stopped taking sertraline due to causing chest pains and palpitations. He reports that he has had symptoms ever since he started taking the medication, but it was mild. He reports he experienced symptoms on a daily basis. Yesterday, his symptoms were at its worst and he was seen at the ED. He did not notice any improvement of his mood while taking it. He did state that it gave him "brain fog". Patient has a f/u appt with you at the end of the month, and reports that he will discuss possible medication changes then.   Is this a medication he can just stop taking, or does he need to wean off? Please advise. Thanks!

## 2020-05-20 NOTE — Telephone Encounter (Signed)
Copied from CRM [id number]. Topic: General - Inquiry >> May 20, 2020 10:47 AM Adrian Prince D wrote: Reason for TDS:[id number] would like a call back from Dr. Sullivan Lone. He would like to update him on some information concerning his sertraline (Zoloft) He can be reached at [phone]. Please advise

## 2020-05-20 NOTE — Telephone Encounter (Signed)
Please set up a virtual visit with someone.

## 2020-05-20 NOTE — Telephone Encounter (Signed)
Patient reports that he has transportation on Thursday. He is wanting to come then to discuss medication. Appt scheduled.

## 2020-05-23 ENCOUNTER — Encounter: Payer: Self-pay | Admitting: Family Medicine

## 2020-05-23 ENCOUNTER — Ambulatory Visit (INDEPENDENT_AMBULATORY_CARE_PROVIDER_SITE_OTHER): Payer: Self-pay | Admitting: Family Medicine

## 2020-05-23 ENCOUNTER — Other Ambulatory Visit: Payer: Self-pay

## 2020-05-23 VITALS — BP 125/79 | HR 65 | Temp 98.5°F | Resp 16 | Ht 65.0 in | Wt 191.0 lb

## 2020-05-23 DIAGNOSIS — F331 Major depressive disorder, recurrent, moderate: Secondary | ICD-10-CM

## 2020-05-23 DIAGNOSIS — G4489 Other headache syndrome: Secondary | ICD-10-CM

## 2020-05-23 DIAGNOSIS — J301 Allergic rhinitis due to pollen: Secondary | ICD-10-CM

## 2020-05-23 DIAGNOSIS — K219 Gastro-esophageal reflux disease without esophagitis: Secondary | ICD-10-CM

## 2020-05-23 MED ORDER — OMEPRAZOLE 20 MG PO CPDR: 20.0000 mg | DELAYED_RELEASE_CAPSULE | Freq: Every day | ORAL | 3 refills | Status: DC

## 2020-05-23 MED ORDER — CITALOPRAM HYDROBROMIDE 10 MG PO TABS: 10.0000 mg | ORAL_TABLET | Freq: Every day | ORAL | 3 refills | Status: DC

## 2020-05-23 NOTE — Patient Instructions (Signed)
Stop Vitamins and Ibuprofen. Try Tylenol for headaches.

## 2020-05-23 NOTE — Progress Notes (Signed)
Established patient visit   Patient: Ryan Cooley   DOB: 1986-01-21   35 y.o. Male  MRN: [id number] Visit Date: 05/23/2020  Today's healthcare provider: Megan Mans, MD   Chief Complaint  Patient presents with  . ER follow up    Subjective    HPI  This is a new visit for me as I have not seen the patient since he was a teenager. He is trying to get disability and has multiple complaints today. Chest pain does not sound cardiac, please see enclosed note from ED.  The chest pain seems to be aggravated by taking vitamins and ibuprofen. He complains of chronic headaches. Follow up ER visit  Patient was seen in ER at [facility] for chest pain on 05/19/2020. He was treated for atypical chest pain. He reports he recently started taking sertraline 50mg  and decided to switch from taking it at night to taking in the morning.  He thinks this may be related to his symptoms.  No history of heart disease. Treatment for this included GI cocktail in the ER. He reports good compliance with treatment. He reports this condition is Unchanged.       Medications: Outpatient Medications Prior to Visit  Medication Sig  . Multiple Vitamin (MULTI-VITAMINS) TABS Take by mouth.  . triamcinolone (NASACORT) 55 MCG/ACT AERO nasal inhaler Place 2 sprays into the nose daily.  . brompheniramine-pseudoephedrine-DM 30-2-10 MG/5ML syrup Take 5 mLs by mouth 4 (four) times daily as needed. Mix with 5 mL of viscous lidocaine for swish and swallow. (Patient not taking: No sig reported)  . Cholecalciferol (VITAMIN D3) 1000 units CAPS Take 1 capsule by mouth daily. (Patient not taking: No sig reported)  . ferrous sulfate 325 (65 FE) MG EC tablet Take 325 mg by mouth 3 (three) times daily with meals. (Patient not taking: No sig reported)  . lidocaine (XYLOCAINE) 2 % solution Use as directed 5 mLs in the mouth or throat every 6 (six) hours as needed for mouth pain. Mix with 5 mL of Bromfed-DM for swish and  swallow (Patient not taking: No sig reported)  . methylPREDNISolone (MEDROL DOSEPAK) 4 MG TBPK tablet Take Tapered dose as directed (Patient not taking: Reported on 05/23/2020)  . promethazine (PHENERGAN) 25 MG tablet Take 1 tablet (25 mg total) by mouth every 8 (eight) hours as needed for nausea or vomiting. (Patient not taking: Reported on 05/23/2020)  . sertraline (ZOLOFT) 50 MG tablet Take 1 tablet (50 mg total) by mouth daily. (Patient not taking: Reported on 05/23/2020)   No facility-administered medications prior to visit.    Review of Systems  Constitutional: Negative.   Respiratory: Negative.   Cardiovascular: Negative.   Musculoskeletal: Negative.   Neurological: Negative.         Objective    BP 125/79   Pulse 65   Temp 98.5 F (36.9 C)   Resp 16   Ht 5\' 5"  (1.651 m)   Wt 191 lb (86.6 kg)   BMI 31.78 kg/m  BP Readings from Last 3 Encounters:  05/23/20 125/79  05/19/20 120/83  07/14/19 116/80   Wt Readings from Last 3 Encounters:  05/23/20 191 lb (86.6 kg)  05/19/20 185 lb (83.9 kg)  07/14/19 180 lb (81.6 kg)       Physical Exam Vitals reviewed.  HENT:     Head: Normocephalic and atraumatic.     Right Ear: External ear normal.     Left Ear: External ear normal.  Eyes:     General: No scleral icterus.    Conjunctiva/sclera: Conjunctivae normal.     Pupils: Pupils are equal, round, and reactive to light.  Cardiovascular:     Rate and Rhythm: Normal rate and regular rhythm.     Pulses: Normal pulses.     Heart sounds: Normal heart sounds.  Pulmonary:     Effort: Pulmonary effort is normal.     Breath sounds: Normal breath sounds.  Abdominal:     Palpations: Abdomen is soft.  Musculoskeletal:     Right lower leg: No edema.     Left lower leg: No edema.  Skin:    General: Skin is warm and dry.  Neurological:     General: No focal deficit present.     Mental Status: He is alert and oriented to person, place, and time.  Psychiatric:        Mood and  Affect: Mood normal.        Behavior: Behavior normal.        Thought Content: Thought content normal.        Judgment: Judgment normal.       No results found for any visits on 05/23/20.  Assessment & Plan     1. Gastroesophageal reflux disease, unspecified whether esophagitis present I think this is a source of his chest pain.  I have advised him to stop vitamins ibuprofen and take Tylenol for his headaches.  Start omeprazole and I will follow-up in a couple of months. - omeprazole (PRILOSEC) 20 MG capsule; Take 1 capsule (20 mg total) by mouth daily.  Dispense: 30 capsule; Refill: 3  2. Moderate episode of recurrent major depressive disorder (HCC) Stop sertraline and start citalopram 10 mg daily.  Follow-up 2 months - citalopram (CELEXA) 10 MG tablet; Take 1 tablet (10 mg total) by mouth daily.  Dispense: 30 tablet; Refill: 3  3. Non-seasonal allergic rhinitis due to pollen   4. Headache syndrome Take Tylenol for the headaches.  May need neurology evaluation but this seems to be a secondary complaint that has been going on for years. He is trying to get disability.  Complains of chronic back pain.   No follow-ups on file.      I, Megan Mans, MD, have reviewed all documentation for this visit. The documentation on 05/27/20 for the exam, diagnosis, procedures, and orders are all accurate and complete.    Richard Wendelyn Breslow, MD  [facility] [phone] (phone) [phone] (fax)  [facility]

## 2020-06-06 ENCOUNTER — Ambulatory Visit: Payer: Self-pay | Admitting: Family Medicine

## 2020-07-09 NOTE — Progress Notes (Signed)
This encounter was created in error - please disregard.

## 2020-07-24 ENCOUNTER — Ambulatory Visit (INDEPENDENT_AMBULATORY_CARE_PROVIDER_SITE_OTHER): Payer: Self-pay | Admitting: Family Medicine

## 2020-07-24 ENCOUNTER — Encounter: Payer: Self-pay | Admitting: Family Medicine

## 2020-07-24 ENCOUNTER — Other Ambulatory Visit: Payer: Self-pay

## 2020-07-24 VITALS — BP 121/76 | HR 66 | Temp 98.2°F | Resp 16 | Wt 192.0 lb

## 2020-07-24 DIAGNOSIS — F331 Major depressive disorder, recurrent, moderate: Secondary | ICD-10-CM

## 2020-07-24 DIAGNOSIS — K029 Dental caries, unspecified: Secondary | ICD-10-CM

## 2020-07-24 MED ORDER — MELOXICAM 15 MG PO TABS: 15.0000 mg | ORAL_TABLET | Freq: Every day | ORAL | 5 refills | Status: DC | PRN

## 2020-07-24 NOTE — Progress Notes (Signed)
I,Roshena L Chambers,acting as a scribe for Megan Mans, MD.,have documented all relevant documentation on the behalf of Megan Mans, MD,as directed by  Megan Mans, MD while in the presence of Megan Mans, MD.   Established patient visit   Patient: Ryan Cooley   DOB: 1986/01/04   35 y.o. Male  MRN: [id number] Visit Date: 07/24/2020  Today's healthcare provider: Megan Mans, MD   Chief Complaint  Patient presents with  . Depression  . Gastroesophageal Reflux   Subjective    HPI  Patient states he definitely feels better since switching to citalopram. He is having ongoing problems with pain from dental caries and does not have a dentist at this time. He is trying to pursue disability due to ADD but this has never been diagnosed.  He has never to my knowledge had neurodevelopment evaluation.  Depression, Follow-up  He  was last seen for this 2 months ago. Changes made at last visit include; Stopped sertraline and started citalopram 10 mg daily.  Follow-up 2 months   He reports good compliance with treatment. He is not having side effects.   He reports good tolerance of treatment. Current symptoms include: depressed mood He feels he is Improved since last visit.  Depression screen [facility] 2/9 07/24/2020 03/14/2018  Decreased Interest 1 0  Down, Depressed, Hopeless 1 0  PHQ - 2 Score 2 0  Altered sleeping 0 -  Tired, decreased energy 3 -  Change in appetite 0 -  Feeling bad or failure about yourself  1 -  Trouble concentrating 1 -  Moving slowly or fidgety/restless 0 -  Suicidal thoughts 0 -  PHQ-9 Score 7 -  Difficult doing work/chores Very difficult -    -----------------------------------------------------------------------------------------  Gastroesophageal reflux disease, unspecified whether esophagitis present From 05/23/2020-I think this is a source of his chest pain.  I have advised him to stop vitamins ibuprofen and take  Tylenol for his headaches.  Start omeprazole and I will follow-up in a couple of months.Today patient reports that this problem has resolved. He tried taking Omeprazole for a month and stopped after symptoms improved.   Headache syndrome From 05/23/2020-Take Tylenol for the headaches.  May need neurology evaluation but this seems to be a secondary complaint that has been going on for years. He is trying to get disability.  Complains of chronic back pain.      Medications: Outpatient Medications Prior to Visit  Medication Sig  . citalopram (CELEXA) 10 MG tablet Take 1 tablet (10 mg total) by mouth daily.  Marland Kitchen omeprazole (PRILOSEC) 20 MG capsule Take 1 capsule (20 mg total) by mouth daily. (Patient not taking: Reported on 07/24/2020)  . triamcinolone (NASACORT) 55 MCG/ACT AERO nasal inhaler Place 2 sprays into the nose daily. (Patient not taking: Reported on 07/24/2020)  . [DISCONTINUED] brompheniramine-pseudoephedrine-DM 30-2-10 MG/5ML syrup Take 5 mLs by mouth 4 (four) times daily as needed. Mix with 5 mL of viscous lidocaine for swish and swallow. (Patient not taking: No sig reported)  . [DISCONTINUED] Cholecalciferol (VITAMIN D3) 1000 units CAPS Take 1 capsule by mouth daily. (Patient not taking: No sig reported)  . [DISCONTINUED] ferrous sulfate 325 (65 FE) MG EC tablet Take 325 mg by mouth 3 (three) times daily with meals. (Patient not taking: No sig reported)  . [DISCONTINUED] lidocaine (XYLOCAINE) 2 % solution Use as directed 5 mLs in the mouth or throat every 6 (six) hours as needed for mouth pain. Mix with  5 mL of Bromfed-DM for swish and swallow (Patient not taking: No sig reported)  . [DISCONTINUED] methylPREDNISolone (MEDROL DOSEPAK) 4 MG TBPK tablet Take Tapered dose as directed (Patient not taking: Reported on 05/23/2020)  . [DISCONTINUED] Multiple Vitamin (MULTI-VITAMINS) TABS Take by mouth. (Patient not taking: Reported on 07/24/2020)  . [DISCONTINUED] promethazine (PHENERGAN) 25 MG  tablet Take 1 tablet (25 mg total) by mouth every 8 (eight) hours as needed for nausea or vomiting. (Patient not taking: Reported on 05/23/2020)   No facility-administered medications prior to visit.    Review of Systems  Constitutional: Negative for appetite change, chills and fever.  Respiratory: Negative for chest tightness, shortness of breath and wheezing.   Cardiovascular: Negative for chest pain and palpitations.  Gastrointestinal: Negative for abdominal pain, nausea and vomiting.        Objective    BP 121/76 (BP Location: Right Arm, Patient Position: Sitting, Cuff Size: Normal)   Pulse 66   Temp 98.2 F (36.8 C) (Temporal)   Resp 16   Wt 192 lb (87.1 kg)   BMI 31.95 kg/m  Wt Readings from Last 3 Encounters:  07/24/20 192 lb (87.1 kg)  05/23/20 191 lb (86.6 kg)  05/19/20 185 lb (83.9 kg)       Physical Exam Vitals reviewed.  HENT:     Head: Normocephalic and atraumatic.     Right Ear: External ear normal.     Left Ear: External ear normal.  Eyes:     General: No scleral icterus.    Conjunctiva/sclera: Conjunctivae normal.     Pupils: Pupils are equal, round, and reactive to light.  Cardiovascular:     Rate and Rhythm: Normal rate and regular rhythm.     Pulses: Normal pulses.     Heart sounds: Normal heart sounds.  Pulmonary:     Effort: Pulmonary effort is normal.     Breath sounds: Normal breath sounds.  Abdominal:     Palpations: Abdomen is soft.  Musculoskeletal:     Right lower leg: No edema.     Left lower leg: No edema.  Skin:    General: Skin is warm and dry.  Neurological:     General: No focal deficit present.     Mental Status: He is alert and oriented to person, place, and time.  Psychiatric:        Mood and Affect: Mood normal.        Behavior: Behavior normal.        Thought Content: Thought content normal.        Judgment: Judgment normal.       No results found for any visits on 07/24/20.  Assessment & Plan    1. Moderate  episode of recurrent major depressive disorder (HCC) Improved. Continue Citalopram 10 mg daily.  Follow-up in 6 months but would like to get neurodevelopmental disorder as I think the brought patient probably has some autism as opposed to ADD. 2. Pain due to dental caries - meloxicam (MOBIC) 15 MG tablet; Take 1 tablet (15 mg total) by mouth daily as needed for pain.  Dispense: 30 tablet; Refill: 5   Return in about 6 months (around 01/23/2021) for CPE.      I, Megan Mans, MD, have reviewed all documentation for this visit. The documentation on 08/03/20 for the exam, diagnosis, procedures, and orders are all accurate and complete.    Pete Schnitzer Wendelyn Breslow, MD  [facility] [phone] (phone) [phone] (fax)  [facility]

## 2020-09-17 ENCOUNTER — Other Ambulatory Visit: Payer: Self-pay | Admitting: Family Medicine

## 2020-09-17 DIAGNOSIS — F331 Major depressive disorder, recurrent, moderate: Secondary | ICD-10-CM

## 2020-10-28 ENCOUNTER — Emergency Department
Admission: EM | Admit: 2020-10-28 | Discharge: 2020-10-28 | Disposition: A | Discharge status: Home / Self Care | Payer: Self-pay | Attending: Emergency Medicine | Admitting: Emergency Medicine

## 2020-10-28 ENCOUNTER — Encounter: Payer: Self-pay | Admitting: Emergency Medicine

## 2020-10-28 DIAGNOSIS — M549 Dorsalgia, unspecified: Secondary | ICD-10-CM | POA: Insufficient documentation

## 2020-10-28 DIAGNOSIS — R519 Headache, unspecified: Secondary | ICD-10-CM | POA: Insufficient documentation

## 2020-10-28 DIAGNOSIS — K029 Dental caries, unspecified: Secondary | ICD-10-CM | POA: Insufficient documentation

## 2020-10-28 DIAGNOSIS — K0889 Other specified disorders of teeth and supporting structures: Secondary | ICD-10-CM

## 2020-10-28 MED ORDER — OXYCODONE-ACETAMINOPHEN 5-325 MG PO TABS
2.0000 | ORAL_TABLET | Freq: Once | ORAL | Status: AC
Start: 2020-10-28 — End: 2020-10-28
  Administered 2020-10-28: 2 via ORAL
  Filled 2020-10-28: qty 2

## 2020-10-28 MED ORDER — OXYCODONE-ACETAMINOPHEN 5-325 MG PO TABS: 2.0000 | ORAL_TABLET | Freq: Four times a day (QID) | ORAL | 0 refills | Status: DC | PRN

## 2020-10-28 NOTE — Discharge Instructions (Addendum)
You have been seen in the Emergency Department (ED) today for dental pain.  You may take pain medication as needed but ONLY as prescribed.  You should also take over-the-counter pain medication such as ibuprofen according to the label instructions unless a doctor has previously told you to avoid this type of medication (due to stomach ulcers, for example).  Alternatively you can take ibuprofen 600 mg by mouth three times daily with meals for no more than 5 days.  Please see you dentist as soon as possible; only a dentist will be able to fix your problem(s).  Please see below for dental follow up options.  Return to the ED if you develop worsening pain, fever, pus/drainage, difficulty breathing, or other symptoms that concern you.

## 2020-10-28 NOTE — ED Triage Notes (Signed)
Pt c/o right upper dental pain x2 days with no fever, swelling or discharge. Pt with poor dental care with obvious decay noted.

## 2020-10-28 NOTE — ED Provider Notes (Signed)
[facility] Emergency Department Provider Note  ____________________________________________   Event Date/Time   First MD Initiated Contact with Patient 10/28/20 [phone]     (approximate)  I have reviewed the triage vital signs and the nursing notes.   HISTORY  Chief Complaint Dental Pain    HPI Ryan Cooley is a 35 y.o. male who presents for evaluation of dental pain that radiates into his face and the back of his head.  He said that he knows he has dental problems and in fact went to a dentist recently.  He was supposed to have a teeth extracted but incidentally extracted 2.  They told him that he needs to go to [facility] for additional care.  He is in the process of trying to arrange that but in the meantime over the last 2 days he has had gradually worsening throbbing and aching pain from the right upper part of his mouth that radiates through his face and into the back of his head.  He has tried Orajel, diclofenac gel, ibuprofen, and meloxicam, but nothing helps.  He has no swelling.  He has no difficulty swallowing or sore throat.  Nothing in particular seems to make it better or worse.     History reviewed. No pertinent past medical history.  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 Authorizing Provider  oxyCODONE-acetaminophen (PERCOCET) 5-325 MG tablet Take 2 tablets by mouth  every 6 (six) hours as needed for severe pain. 10/28/20  Yes Loleta Rose, MD  citalopram (CELEXA) 10 MG tablet TAKE 1 TABLET(10 MG) BY MOUTH DAILY 09/17/20   Maple Hudson., MD  meloxicam (MOBIC) 15 MG tablet Take 1 tablet (15 mg total) by mouth daily as needed for pain. 07/24/20   Maple Hudson., MD  omeprazole (PRILOSEC) 20 MG capsule Take 1 capsule (20 mg total) by mouth daily. Patient not taking: Reported on 07/24/2020 05/23/20   Maple Hudson., MD  triamcinolone (NASACORT) 55 MCG/ACT AERO nasal inhaler Place 2 sprays into the nose daily. Patient not taking: Reported on 07/24/2020    [provider]    Allergies Patient has no known allergies.  Family History  Problem Relation Age of Onset   Depression Mother    Diabetes Father    Healthy Brother    Diabetes Maternal Grandmother    Heart disease Maternal Grandmother    Cancer Maternal Grandfather        lung   Healthy Brother     Social History Social History   Tobacco Use   Smoking status: Never   Smokeless tobacco: Never  Substance Use Topics   Alcohol use: No    Alcohol/week: 0.0 standard drinks   Drug use: No    Review of Systems Constitutional: No fever/chills Eyes: No visual changes. ENT: Positive for dental pain radiating into his face and head.  No dysphagia nor odynophagia. Cardiovascular: Denies  chest pain. Respiratory: Denies shortness of breath. Musculoskeletal: Negative for neck pain.  Negative for back pain. Integumentary: Negative for rash. Neurological: Negative for headaches, focal weakness or numbness.   ____________________________________________   PHYSICAL EXAM:  VITAL SIGNS: ED Triage Vitals  Enc Vitals Group     BP 10/28/20 0321 (!) 147/89     Pulse Rate 10/28/20 0321 70     Resp 10/28/20 0321 18     Temp 10/28/20 0321 98.5 F (36.9 C)     Temp Source 10/28/20 0321 Oral     SpO2 10/28/20 0321 97 %     Weight 10/28/20 0321 86.2 kg (190 lb)     Height  10/28/20 0321 1.651 m (5\' 5" )     Head Circumference --      Peak Flow --      Pain Score 10/28/20 0517 7     Pain Loc --      Pain Edu? --      Excl. in GC? --     Constitutional: Alert and oriented.  Eyes: Conjunctivae are normal.  Head: Atraumatic.  No tenderness to palpation of the face along any nerve distribution.  No angioedema. Nose: No congestion/rhinnorhea. Mouth/Throat: Chronic dental caries and tooth decay but no evidence of ANUG, Ludwig's angina, abscess, or acute infection.  No airway compromise. Neck: No stridor.  No meningeal signs.  No cervical lymphadenopathy. Cardiovascular: Normal rate, regular rhythm. Good peripheral circulation. Respiratory: Normal respiratory effort.  No retractions. Neurologic:  Normal speech and language. No gross focal neurologic deficits are appreciated.  Skin:  Skin is warm, dry and intact. Psychiatric: Mood and affect are normal. Speech and behavior are normal.  ____________________________________________    INITIAL IMPRESSION / MDM / ASSESSMENT AND PLAN / ED COURSE  As part of my medical decision making, I reviewed the following data within the electronic MEDICAL RECORD NUMBER Nursing notes reviewed and incorporated, Old chart reviewed, and [place] Controlled Substance Database  No evidence of an emergent infection or other medical or dental condition at this time.  No obvious active infection benefit from antibiotics.  I reviewed the 10/30/20 controlled substance database and he has no prescriptions on record.  He is in the process of trying to arrange follow-up with the [facility] school of dentistry.  He has tried many things as an outpatient that are not helping.  I explained that the emergency department is not the appropriate place to receive care for ongoing pain but in this 1 case I will provide some Percocet to help but I stressed that it was a one-time treatment and that he needs to follow-up as an outpatient.  He said that he understands.   Prescription as listed below.  I gave my usual and customary follow-up recommendations and return precautions.    ____________________________________________  FINAL CLINICAL IMPRESSION(S) / ED DIAGNOSES  Final diagnoses:  Pain, dental  Facial pain     MEDICATIONS GIVEN DURING THIS VISIT:  Medications  oxyCODONE-acetaminophen (PERCOCET/ROXICET) 5-325 MG per tablet 2 tablet (2 tablets Oral Given 10/28/20 0516)     ED Discharge Orders          Ordered    oxyCODONE-acetaminophen (PERCOCET) 5-325 MG tablet  Every 6 hours PRN        10/28/20 0523             Note:  This document was prepared using Dragon voice recognition software and may include unintentional dictation errors.   10/30/20, MD 10/28/20 [phone]

## 2020-10-28 NOTE — ED Notes (Signed)
Pt In with co toothache since Saturday, states already knew he has teeth that need to be removed but has not been able to get to dentist. States did have 2 removed last week but still has other teeth that need to be extracted.

## 2020-11-05 ENCOUNTER — Encounter: Payer: Self-pay | Admitting: Family Medicine

## 2020-11-05 ENCOUNTER — Telehealth: Payer: Self-pay | Admitting: Family Medicine

## 2020-11-05 ENCOUNTER — Telehealth (INDEPENDENT_AMBULATORY_CARE_PROVIDER_SITE_OTHER): Payer: Self-pay | Admitting: Family Medicine

## 2020-11-05 DIAGNOSIS — K0889 Other specified disorders of teeth and supporting structures: Secondary | ICD-10-CM

## 2020-11-05 NOTE — Progress Notes (Signed)
Telephone Visit    Virtual Visit via Video Note   This visit type was conducted due to national recommendations for restrictions regarding the COVID-19 Pandemic (e.g. social distancing) in an effort to limit this patient's exposure and mitigate transmission in our community. This patient is at least at moderate risk for complications without adequate follow up. This format is felt to be most appropriate for this patient at this time. Physical exam was limited by quality of the audio technology used for the visit.   Patient location: home Provider location: office  I discussed the limitations of evaluation and management by telemedicine and the availability of in person appointments. The patient expressed understanding and agreed to proceed.  Patient: Ryan Cooley   DOB: [birth year]   35 y.o. Male  MRN: [id number] Visit Date: 11/05/2020  Today's healthcare provider: Dortha Kern, PA-C   No chief complaint on file.  Subjective    HPI  Having dental and facial pain the past 2-3 weeks. Had a visit to the ER on 10-28-20 and was given some Oxycodone with only mild relief. Gets more relief from Ibuprofen 200 mg 3-4 tablets 3-4 times a day. Trying to get dental care set up for extractions.  No past medical history on file. Past Surgical History:  Procedure Laterality Date   NO PAST SURGERIES     Social History   Tobacco Use   Smoking status: Never   Smokeless tobacco: Never  Substance Use Topics   Alcohol use: No    Alcohol/week: 0.0 standard drinks   Drug use: No   Family History  Problem Relation Age of Onset   Depression Mother    Diabetes Father    Healthy Brother    Diabetes Maternal Grandmother    Heart disease Maternal Grandmother    Cancer Maternal Grandfather        lung   Healthy Brother    No Known Allergies    Medications: Outpatient Medications Prior to Visit  Medication Sig   citalopram (CELEXA) 10 MG tablet TAKE 1 TABLET(10 MG) BY MOUTH DAILY    meloxicam (MOBIC) 15 MG tablet Take 1 tablet (15 mg total) by mouth daily as needed for pain.   omeprazole (PRILOSEC) 20 MG capsule Take 1 capsule (20 mg total) by mouth daily. (Patient not taking: Reported on 07/24/2020)   oxyCODONE-acetaminophen (PERCOCET) 5-325 MG tablet Take 2 tablets by mouth every 6 (six) hours as needed for severe pain.   triamcinolone (NASACORT) 55 MCG/ACT AERO nasal inhaler Place 2 sprays into the nose daily. (Patient not taking: Reported on 07/24/2020)   No facility-administered medications prior to visit.    Review of Systems  Constitutional: Negative.  Negative for fever.  HENT:  Positive for dental problem.      Objective    There were no vitals taken for this visit.    During telephonic interview, no acute respiratory distress.    Assessment & Plan     1. Toothache Recurrent pain in several teeth that his dentist tried to extract, but, he had some problems during the procedure and had to stop. Little to no relief from Oxycodone. May use the Ibuprofen up to a max of 12 tablets in a day and apply Oil of Clove for topical anesthesia. Proceed with dental care.   No follow-ups on file.     I discussed the assessment and treatment plan with the patient. The patient was provided an opportunity to ask questions and all were answered. The patient  agreed with the plan and demonstrated an understanding of the instructions.   The patient was advised to call back or seek an in-person evaluation if the symptoms worsen or if the condition fails to improve as anticipated.  I provided 20 minutes of non-face-to-face time during this encounter.  I, Adysson Revelle, PA-C, have reviewed all documentation for this visit. The documentation on 11/05/20 for the exam, diagnosis, procedures, and orders are all accurate and complete.   Dortha Kern, PA-C [employer] [phone] (phone) [phone]  [facility]

## 2020-11-05 NOTE — Progress Notes (Deleted)
Virtual Visit via Telephone Note  I connected with Ryan Cooley on 11/05/20 at 11:56 am by telephone and verified that I am speaking with the correct person using two identifiers. Patient connection in and out.  Informed patient of his MyChart Username and updated password so that he can connect to visit. Could not hear pt to verify medications   Location: Patient: Ryan Cooley Provider: Dr. Dortha Kern   I discussed the limitations, risks, security and privacy concerns of performing an evaluation and management service by telephone and the availability of in person appointments. I also discussed with the patient that there may be a patient responsible charge related to this service. The patient expressed understanding and agreed to proceed.   History of Present Illness:    Observations/Objective:   Assessment and Plan:   Follow Up Instructions:    I discussed the assessment and treatment plan with the patient. The patient was provided an opportunity to ask questions and all were answered. The patient agreed with the plan and demonstrated an understanding of the instructions.   The patient was advised to call back or seek an in-person evaluation if the symptoms worsen or if the condition fails to improve as anticipated.  I provided 2 minutes of non-face-to-face time during this encounter. Connection was lost   Acey Lav, CMA

## 2020-11-29 ENCOUNTER — Ambulatory Visit: Payer: Self-pay | Admitting: *Deleted

## 2020-11-29 NOTE — Telephone Encounter (Addendum)
Message from Traci Sermon sent at 11/29/2020 10:13 AM EDT  Pts mom called in stating pt has covid, and has a cough and a fever, and they were told to call to see about getting some medication or what they can use. Please advise.     Call History   Type Contact Phone/Fax User  11/29/2020 11:20 AM EDT Phone ([address]) Ervie, Mccard (Self) [phone] Rexene Edison) Karsten Fells, RN  Left Message -  No answer-left VM to return call to speak with a nurse.  11/29/2020 10:12 AM EDT Phone (Incoming) Fritz, Cauthon (Self) [phone] Lorin Mercy   Encounter Report  Patient Encounter Report  Attempted to reach pt. X 3 to discuss COVID 19 symptoms. Unable to reach pt.

## 2020-12-02 NOTE — Telephone Encounter (Signed)
Called and spoke with patients mother he reports that patient has had headache, sore throat and cough for 4 days, she states that patient has not tested for Covid and states they do not have the funds to buy test at home, patients mother reports that she is with similar symptoms as well and has not been tested herself. Appointment has been made for 12/03/20 for telephone for patient to be assessed and evaluated. Patients mother declined my chart video visit for patient. KW

## 2020-12-03 ENCOUNTER — Encounter: Payer: Self-pay | Admitting: Family Medicine

## 2020-12-03 ENCOUNTER — Telehealth (INDEPENDENT_AMBULATORY_CARE_PROVIDER_SITE_OTHER): Payer: Self-pay | Admitting: Family Medicine

## 2020-12-03 DIAGNOSIS — U071 COVID-19: Secondary | ICD-10-CM

## 2020-12-03 DIAGNOSIS — R059 Cough, unspecified: Secondary | ICD-10-CM

## 2020-12-03 NOTE — Progress Notes (Signed)
Virtual telephone visit    Virtual Visit via Telephone Note   This visit type was conducted due to national recommendations for restrictions regarding the COVID-19 Pandemic (e.g. social distancing) in an effort to limit this patient's exposure and mitigate transmission in our community. Due to his co-morbid illnesses, this patient is at least at moderate risk for complications without adequate follow up. This format is felt to be most appropriate for this patient at this time. The patient did not have access to video technology or had technical difficulties with video requiring transitioning to audio format only (telephone). Physical exam was limited to content and character of the telephone converstion.    Patient location: Home Provider location: Office  I discussed the limitations of evaluation and management by telemedicine and the availability of in person appointments. The patient expressed understanding and agreed to proceed.  This was a telephone call as the patient evidently did not have access to computer link. Visit Date: 12/03/2020  Today's healthcare provider: Megan Mans, MD   No chief complaint on file.  Subjective    HPI  Patient has had 6 days of cough and congestion and postnasal drainage.  He tested positive for COVID earlier today.  He is getting better.  No fevers and no shortness of breath.  He has not had a headache some sore throat and a little bit of nausea and diarrhea.       Medications: Outpatient Medications Prior to Visit  Medication Sig   citalopram (CELEXA) 10 MG tablet TAKE 1 TABLET(10 MG) BY MOUTH DAILY   meloxicam (MOBIC) 15 MG tablet Take 1 tablet (15 mg total) by mouth daily as needed for pain.   omeprazole (PRILOSEC) 20 MG capsule Take 1 capsule (20 mg total) by mouth daily. (Patient not taking: Reported on 07/24/2020)   oxyCODONE-acetaminophen (PERCOCET) 5-325 MG tablet Take 2 tablets by mouth every 6 (six) hours as needed for severe  pain.   triamcinolone (NASACORT) 55 MCG/ACT AERO nasal inhaler Place 2 sprays into the nose daily. (Patient not taking: Reported on 07/24/2020)   No facility-administered medications prior to visit.    Review of Systems  Constitutional:  Negative for appetite change, chills and fever.  HENT:  Positive for sore throat.   Respiratory:  Positive for cough. Negative for chest tightness, shortness of breath and wheezing.   Cardiovascular:  Negative for chest pain and palpitations.  Gastrointestinal:  Negative for abdominal pain, nausea and vomiting.  Neurological:  Positive for headaches.      Objective    There were no vitals taken for this visit.      Assessment & Plan     1. COVID-19 Mild illness that is a week into the illness and 35 year old.   Certainly too late for any antiviral treatment but would not be appropriate anyway. At this time push fluids and Tylenol for the headache.  Use vitamin C vitamin D and zinc until well  2. Cough Take Mucinex twice a day until resolution of cough and drainage.   No follow-ups on file.    I discussed the assessment and treatment plan with the patient. The patient was provided an opportunity to ask questions and all were answered. The patient agreed with the plan and demonstrated an understanding of the instructions.   The patient was advised to call back or seek an in-person evaluation if the symptoms worsen or if the condition fails to improve as anticipated.  I provided 11 minutes of non-face-to-face  time during this encounter.  I, Megan Mans, MD, have reviewed all documentation for this visit. The documentation on 12/04/20 for the exam, diagnosis, procedures, and orders are all accurate and complete.   Richard Wendelyn Breslow, MD [facility] [phone] (phone) [phone] (fax)  [facility]

## 2021-01-01 IMAGING — DX DG CHEST 1V PORT
1 series · 1 of 1 positions shown · non-contrast
Comparison: None.

CLINICAL DATA: Cough, fatigue

EXAM:
PORTABLE CHEST 1 VIEW

[chest ap]
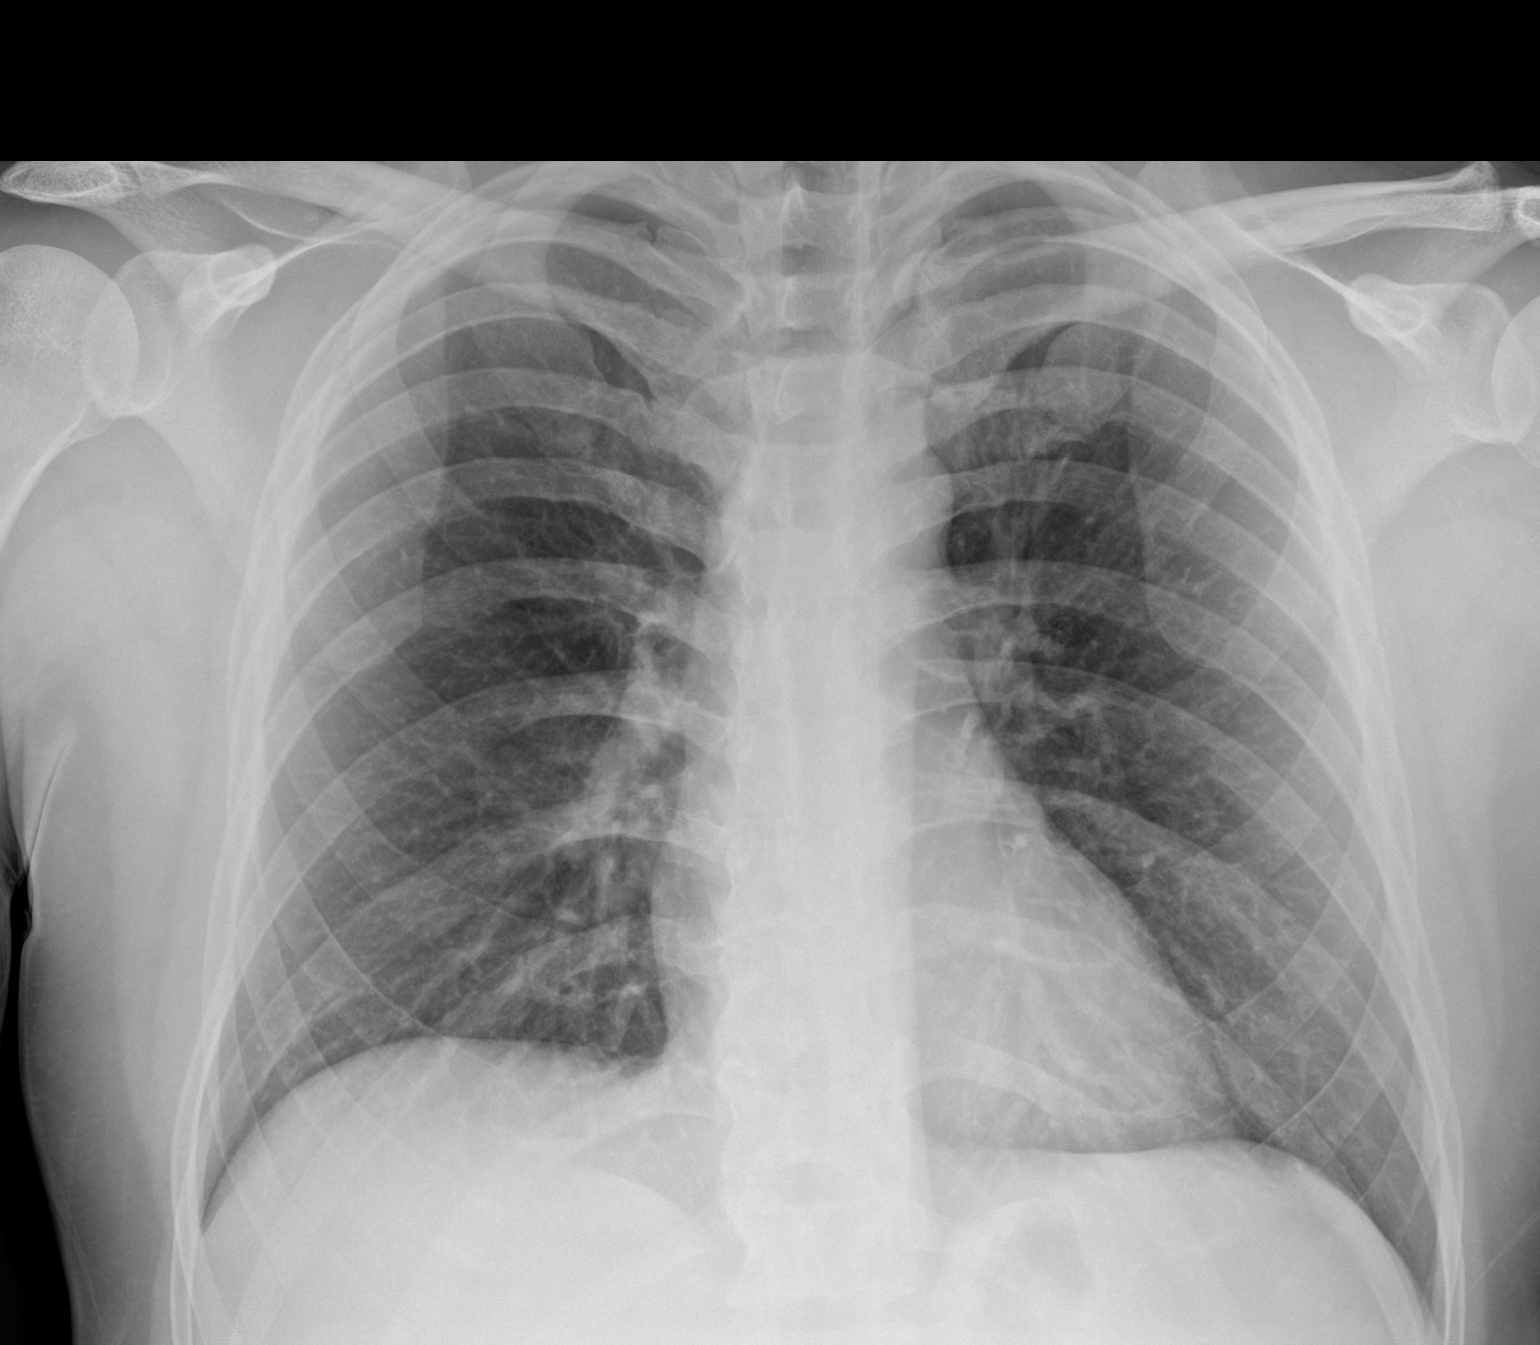

[1 of 1 positions shown; findings below may reference images not displayed]

FINDINGS: The heart size and mediastinal contours are within normal limits. No
focal airspace consolidation, pleural effusion, or pneumothorax. The
visualized skeletal structures are unremarkable.
IMPRESSION: No active disease.

## 2021-01-06 ENCOUNTER — Ambulatory Visit: Payer: Self-pay | Admitting: *Deleted

## 2021-01-06 ENCOUNTER — Other Ambulatory Visit: Payer: Self-pay

## 2021-01-06 ENCOUNTER — Encounter: Payer: Self-pay | Admitting: Emergency Medicine

## 2021-01-06 ENCOUNTER — Emergency Department: Payer: Self-pay

## 2021-01-06 ENCOUNTER — Emergency Department
Admission: EM | Admit: 2021-01-06 | Discharge: 2021-01-06 | Disposition: A | Discharge status: Home / Self Care | Payer: Self-pay | Attending: Emergency Medicine | Admitting: Emergency Medicine

## 2021-01-06 DIAGNOSIS — Z8616 Personal history of COVID-19: Secondary | ICD-10-CM | POA: Insufficient documentation

## 2021-01-06 DIAGNOSIS — R059 Cough, unspecified: Secondary | ICD-10-CM | POA: Insufficient documentation

## 2021-01-06 DIAGNOSIS — R5383 Other fatigue: Secondary | ICD-10-CM | POA: Insufficient documentation

## 2021-01-06 LAB — CBC
HCT: 39 % (ref 39.0–52.0)
Hemoglobin: 14.5 g/dL (ref 13.0–17.0)
MCH: 31.9 pg (ref 26.0–34.0)
MCHC: 37.2 g/dL — ABNORMAL HIGH (ref 30.0–36.0)
MCV: 85.7 fL (ref 80.0–100.0)
Platelets: 254 10*3/uL (ref 150–400)
RBC: 4.55 MIL/uL (ref 4.22–5.81)
RDW: 12.6 % (ref 11.5–15.5)
WBC: 5.3 10*3/uL (ref 4.0–10.5)
nRBC: 0 % (ref 0.0–0.2)

## 2021-01-06 LAB — BASIC METABOLIC PANEL
Anion gap: 8 (ref 5–15)
BUN: 10 mg/dL (ref 6–20)
CO2: 26 mmol/L (ref 22–32)
Calcium: 9 mg/dL (ref 8.9–10.3)
Chloride: 104 mmol/L (ref 98–111)
Creatinine, Ser: 0.98 mg/dL (ref 0.61–1.24)
GFR, Estimated: >60 mL/min (ref >60)
Glucose, Bld: 117 mg/dL — ABNORMAL HIGH (ref 70–99)
Potassium: 3.8 mmol/L (ref 3.5–5.1)
Sodium: 138 mmol/L (ref 135–145)

## 2021-01-06 LAB — TROPONIN I (HIGH SENSITIVITY): Troponin I (High Sensitivity): <2 ng/L (ref <18)

## 2021-01-06 MED ORDER — ALBUTEROL SULFATE HFA 108 (90 BASE) MCG/ACT IN AERS: 2.0000 | INHALATION_SPRAY | Freq: Four times a day (QID) | RESPIRATORY_TRACT | 0 refills | Status: AC | PRN

## 2021-01-06 MED ORDER — BENZONATATE 100 MG PO CAPS: 100.0000 mg | ORAL_CAPSULE | Freq: Four times a day (QID) | ORAL | 0 refills | Status: AC | PRN

## 2021-01-06 NOTE — ED Notes (Signed)
Dr Goodman to triage to evaluate and d/c pt 

## 2021-01-06 NOTE — Telephone Encounter (Signed)
Pt called in c/o chest tightness in the center of his chest that started 30 minutes to an hour ago.  He is also short of breath with minimal exertion.   He had Covid in August and still has the hacking cough that varies from one day to the next but seems to be getting worse.   He also c/o lethargy that's not getting better since having Covid.  I have referred him to the ED per the protocol for chest pain.   He was agreeable to this plan and is going to [facility].  I sent my notes to [facility].

## 2021-01-06 NOTE — Discharge Instructions (Addendum)
Please seek medical attention for any high fevers, chest pain, shortness of breath, change in behavior, persistent vomiting, bloody stool or any other new or concerning symptoms.  

## 2021-01-06 NOTE — Telephone Encounter (Signed)
Reason for Disposition  Difficulty breathing    With chest tightness  Answer Assessment - Initial Assessment Questions 1. LOCATION: "Where does it hurt?"       Pt had COvid in August.    I have lingering symptoms that are getting worse.   The coughing varies from day to day.  Worse one day than not so bad.   Today I'm having tightness in my chest.   It's midway up my ribcage in the center.  Started 30 min to an hr ago. 2. RADIATION: "Does the pain go anywhere else?" (e.g., into neck, jaw, arms, back)     No 3. ONSET: "When did the chest pain begin?" (Minutes, hours or days)      30 min. To an hr ago.   I'm coughing a lot.   I had chest pains earlier this year before I had Covid.    4. PATTERN "Does the pain come and go, or has it been constant since it started?"  "Does it get worse with exertion?"      The chest tightness today is tighter feeling when I lean back.   I have some chest pain while I cough.    5. DURATION: "How long does it last" (e.g., seconds, minutes, hours)     30 min to an hr it's lasting 6. SEVERITY: "How bad is the pain?"  (e.g., Scale 1-10; mild, moderate, or severe)    - MILD (1-3): doesn't interfere with normal activities     - MODERATE (4-7): interferes with normal activities or awakens from sleep    - SEVERE (8-10): excruciating pain, unable to do any normal activities       It's a 2-3 on scale. 7. CARDIAC RISK FACTORS: "Do you have any history of heart problems or risk factors for heart disease?" (e.g., angina, prior heart attack; diabetes, high blood pressure, high cholesterol, smoker, or strong family history of heart disease)     No risk factors 8. PULMONARY RISK FACTORS: "Do you have any history of lung disease?"  (e.g., blood clots in lung, asthma, emphysema, birth control pills)     No  9. CAUSE: "What do you think is causing the chest pain?"     The coughing is lingering from Covid.   General lethargy too.   The tightness in my chest is new. 10. OTHER  SYMPTOMS: "Do you have any other symptoms?" (e.g., dizziness, nausea, vomiting, sweating, fever, difficulty breathing, cough)       I get short of breath when I try to do much since Covid.   I have more mucus than what's normal for me.    I always have mucus due to allergies.   I'm having more mucus in my throat. 11. PREGNANCY: "Is there any chance you are pregnant?" "When was your last menstrual period?"       N/A  In Feb I was seen and they did a heart work up but it was my stomach not my heart.   That was pre Covid.  Protocols used: Chest Pain-A-AH

## 2021-01-06 NOTE — ED Provider Notes (Signed)
[facility] Emergency Department Provider Note   ____________________________________________   I have reviewed the triage vital signs and the nursing notes.   HISTORY  Chief Complaint Cough and Fatigue   History limited by: Not Limited   HPI Ryan Cooley is a 35 y.o. male who presents to the emergency department today because of concerns for continued cough and fatigue.  Patient states that he was diagnosed with COVID roughly 1 month ago.  He did have those symptoms as well as possibly some headache and may be a fever.  At this time the only symptoms that persist are fatigue and cough.  The fatigue is not quite as bad as it was when he had COVID.  Cough has been persistent.  It has been accompanied by some chest tightness in the center of his chest.  Patient states he had asthma as a younger child but has had no recent lung issues.   Records reviewed. Per medical record review patient has a history of COVID-19.  History reviewed. No pertinent past medical history.  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 Authorizing Provider  citalopram (CELEXA) 10 MG tablet TAKE 1 TABLET(10 MG) BY MOUTH DAILY Patient not taking: Reported on 12/03/2020 09/17/20   Maple Hudson., MD  IBUPROFEN PO Take by mouth daily as needed.    [provider]  meloxicam (MOBIC) 15 MG tablet Take 1 tablet  (15 mg total) by mouth daily as needed for pain. Patient not taking: Reported on 12/03/2020 07/24/20   Maple Hudson., MD  omeprazole (PRILOSEC) 20 MG capsule Take 1 capsule (20 mg total) by mouth daily. Patient not taking: Reported on 07/24/2020 05/23/20   Maple Hudson., MD  oxyCODONE-acetaminophen (PERCOCET) 5-325 MG tablet Take 2 tablets by mouth every 6 (six) hours as needed for severe pain. Patient not taking: Reported on 12/03/2020 10/28/20   Loleta Rose, MD  triamcinolone (NASACORT) 55 MCG/ACT AERO nasal inhaler Place 2 sprays into the nose daily. Patient not taking: No sig reported    [provider]    Allergies Patient has no known allergies.  Family History  Problem Relation Age of Onset   Depression Mother    Diabetes Father    Healthy Brother    Diabetes Maternal Grandmother    Heart disease Maternal Grandmother    Cancer Maternal Grandfather        lung   Healthy Brother     Social History Social History   Tobacco Use   Smoking status: Never   Smokeless tobacco: Never  Substance Use Topics   Alcohol use: No    Alcohol/week: 0.0 standard drinks   Drug use: No    Review of Systems Constitutional: No fever/chills Eyes: No visual changes. ENT: No sore throat. Cardiovascular: Positive for chest tightness. Respiratory: Positive for shortness of breath. Gastrointestinal: No abdominal pain.  No nausea, no vomiting.  No diarrhea.  Genitourinary: Negative for dysuria. Musculoskeletal: Negative for back pain. Skin: Negative for rash. Neurological: Negative for headaches, focal weakness or numbness.  ____________________________________________   PHYSICAL EXAM:  VITAL SIGNS: ED Triage Vitals [01/06/21 1531]  Enc Vitals Group     BP 136/78     Pulse Rate 91     Resp 16     Temp 98.2 F (36.8 C)     Temp Source Oral     SpO2 96 %     Weight 190 lb 0.6 oz (86.2 kg)     Height 5\' 5"  (1.651 m)     Head Circumference      Peak Flow       Pain Score 0   Constitutional: Alert and oriented.  Eyes: Conjunctivae are normal.  ENT      Head: Normocephalic and atraumatic.      Nose: No congestion/rhinnorhea.      Mouth/Throat: Mucous membranes are moist.      Neck: No stridor. Hematological/Lymphatic/Immunilogical: No cervical lymphadenopathy. Cardiovascular: Normal rate, regular rhythm.  No murmurs, rubs, or gallops.  Respiratory: Normal respiratory effort without tachypnea nor retractions. Occasional dry cough. Slight expiratory wheeze at bases.  Gastrointestinal: Soft and non tender. No rebound. No guarding.  Genitourinary: Deferred Musculoskeletal: Normal range of motion in all extremities. No lower extremity edema. Neurologic:  Normal speech and language. No gross focal neurologic deficits are appreciated.  Skin:  Skin is warm, dry and intact. No rash noted. Psychiatric: Mood and affect are normal. Speech and behavior are normal. Patient exhibits appropriate insight and judgment.  ____________________________________________    LABS (pertinent positives/negatives)  Trop hs  <2 BMP wnl except glu 117 CBC wbc 5.3, hgb 14.5, plt 254  ____________________________________________   EKG  I, , attending physician, personally viewed and interpreted this EKG  EKG Time: 1534 Rate: 91 Rhythm: normal sinus rhythm Axis: rightward axis Intervals: qtc 410 QRS: narrow ST changes: no st elevation Impression: abnormal ekg ____________________________________________    RADIOLOGY  CXR No acute abnormality   ____________________________________________   PROCEDURES  Procedures  ____________________________________________   INITIAL IMPRESSION / ASSESSMENT AND PLAN / ED COURSE  Pertinent labs & imaging results that were available during my care of the patient were reviewed by me and considered in my medical decision making (see chart for details).   Patient presents to the emergency  department today because of concern for continued cough and fatigue after having covid roughly 1 month ago. On exam patient did have some wheezing in the bases.  Cxr with pna. At this time I think likely patient suffering from lingering symptoms from covid. Did discuss symptomatic treatment. Will give prescription for inhaler and cough medication.  ____________________________________________   FINAL CLINICAL IMPRESSION(S) / ED DIAGNOSES  Final diagnoses:  Cough     Note: This dictation was prepared with Dragon dictation. Any transcriptional errors that result from this process are unintentional     Phineas Semen, MD 01/06/21 2047

## 2021-01-06 NOTE — ED Triage Notes (Signed)
States having residual symptoms from COVID.  Continues to have fatigue and cough and reports tightness in chest today.  Tested positive for COVID 12/08/2020.  AAOx3.  Skin warm and dry. NAD. No SOB/ DOE.

## 2021-01-29 ENCOUNTER — Other Ambulatory Visit: Payer: Self-pay

## 2021-01-29 ENCOUNTER — Ambulatory Visit (INDEPENDENT_AMBULATORY_CARE_PROVIDER_SITE_OTHER): Payer: Self-pay | Admitting: Family Medicine

## 2021-01-29 ENCOUNTER — Encounter: Payer: Self-pay | Admitting: Family Medicine

## 2021-01-29 VITALS — BP 125/83 | HR 80 | Temp 98.4°F | Resp 16 | Ht 65.0 in | Wt 200.0 lb

## 2021-01-29 DIAGNOSIS — Z0000 Encounter for general adult medical examination without abnormal findings: Secondary | ICD-10-CM

## 2021-01-29 DIAGNOSIS — Z23 Encounter for immunization: Secondary | ICD-10-CM

## 2021-01-29 NOTE — Progress Notes (Signed)
I,April Miller,acting as a scribe for Megan Mans, MD.,have documented all relevant documentation on the behalf of Megan Mans, MD,as directed by  Megan Mans, MD while in the presence of Megan Mans, MD.   Complete physical exam   Patient: Ryan Cooley   DOB: 1985/12/30   35 y.o. Male  MRN: [id number] Visit Date: 01/29/2021  Today's healthcare provider: Megan Mans, MD   Chief Complaint  Patient presents with   Annual Exam   Subjective    Ryan Cooley is a 35 y.o. male who presents today for a complete physical exam.  He reports consuming a general diet. Home exercise routine includes some. He generally feels well. He reports sleeping well. He does not have additional problems to discuss today.  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 HPI    History reviewed. No pertinent past medical history. Past Surgical History:  Procedure Laterality Date   NO PAST SURGERIES     Social History   Socioeconomic History   Marital status: Single    Spouse name: Not on file   Number of children: Not on file   Years of education: Not on file   Highest education level: Not on file  Occupational History   Not on file  Tobacco Use   Smoking status: Never   Smokeless tobacco: Never  Substance and Sexual Activity   Alcohol use: No    Alcohol/week: 0.0 standard drinks   Drug use: No   Sexual activity: Not on file  Other Topics Concern   Not on file  Social History Narrative   Not on file   Social Determinants of Health   Financial Resource Strain: Not on file  Food Insecurity: Not on file  Transportation Needs: Not on file  Physical Activity: Not on file  Stress: Not on file  Social Connections: Not on file  Intimate Partner Violence: Not on file   Family Status  Relation Name Status   Mother  Alive   Father  Alive   Brother  Alive   MGM  Alive   MGF  Deceased   PGM  Deceased   PGF  Deceased       MVA   Brother   Alive   Family History  Problem Relation Age of Onset   Depression Mother    Diabetes Father    Healthy Brother    Diabetes Maternal Grandmother    Heart disease Maternal Grandmother    Cancer Maternal Grandfather        lung   Healthy Brother    No Known Allergies  Patient Care Team: Maple Hudson., MD as PCP - General (Family Medicine)   Medications: Outpatient Medications Prior to Visit  Medication Sig   albuterol (VENTOLIN HFA) 108 (90 Base) MCG/ACT inhaler Inhale 2 puffs into the lungs every 6 (six) hours as needed for wheezing or shortness of breath (cough).   IBUPROFEN PO Take by mouth daily as needed.   triamcinolone (NASACORT) 55 MCG/ACT AERO nasal inhaler Place 2 sprays into the nose daily.   benzonatate (TESSALON PERLES) 100 MG capsule Take 1 capsule (100 mg total) by mouth every 6 (six) hours as needed for cough. (Patient not taking: Reported on 01/29/2021)   citalopram (CELEXA) 10 MG tablet TAKE 1 TABLET(10 MG) BY MOUTH DAILY (Patient not taking: No sig reported)   [DISCONTINUED] meloxicam (MOBIC) 15 MG tablet Take 1 tablet (15 mg total) by  mouth daily as needed for pain. (Patient not taking: No sig reported)   [DISCONTINUED] omeprazole (PRILOSEC) 20 MG capsule Take 1 capsule (20 mg total) by mouth daily. (Patient not taking: Reported on 07/24/2020)   [DISCONTINUED] oxyCODONE-acetaminophen (PERCOCET) 5-325 MG tablet Take 2 tablets by mouth every 6 (six) hours as needed for severe pain. (Patient not taking: No sig reported)   No facility-administered medications prior to visit.    Review of Systems  HENT:  Positive for congestion.   Respiratory:  Positive for cough, shortness of breath and wheezing.   All other systems reviewed and are negative.    Objective    BP 125/83 (BP Location: Right Arm, Patient Position: Sitting, Cuff Size: Large)   Pulse 80   Temp 98.4 F (36.9 C) (Temporal)   Resp 16   Ht 5\' 5"  (1.651 m)   Wt 200 lb (90.7 kg)   SpO2 97%    BMI 33.28 kg/m    Physical Exam Vitals reviewed.  Constitutional:      General: He is not in acute distress.    Appearance: He is well-developed.  HENT:     Head: Normocephalic and atraumatic.     Right Ear: Hearing normal.     Left Ear: Hearing normal.     Nose: Nose normal.  Eyes:     General: Lids are normal. No scleral icterus.       Right eye: No discharge.        Left eye: No discharge.     Conjunctiva/sclera: Conjunctivae normal.  Cardiovascular:     Rate and Rhythm: Normal rate and regular rhythm.     Pulses: Normal pulses.     Heart sounds: Normal heart sounds.  Pulmonary:     Effort: Pulmonary effort is normal. No respiratory distress.     Breath sounds: Normal breath sounds.  Abdominal:     Palpations: Abdomen is soft.  Genitourinary:    Penis: Normal.      Testes: Normal.  Lymphadenopathy:     Cervical: No cervical adenopathy.  Skin:    General: Skin is warm and dry.     Findings: No lesion or rash.  Neurological:     General: No focal deficit present.     Mental Status: He is alert and oriented to person, place, and time.  Psychiatric:        Mood and Affect: Mood normal.        Speech: Speech normal.        Behavior: Behavior normal.        Thought Content: Thought content normal.      Last depression screening scores PHQ 2/9 Scores 01/29/2021 07/24/2020 03/14/2018  PHQ - 2 Score 0 2 0  PHQ- 9 Score 2 7 -   Last fall risk screening Fall Risk  01/29/2021  Falls in the past year? 0  Number falls in past yr: 0  Injury with Fall? 0  Risk for fall due to : No Fall Risks  Follow up Falls evaluation completed   Last Audit-C alcohol use screening Alcohol Use Disorder Test (AUDIT) 01/29/2021  1. How often do you have a drink containing alcohol? 0  2. How many drinks containing alcohol do you have on a typical day when you are drinking? 0  3. How often do you have six or more drinks on one occasion? 0  AUDIT-C Score 0   A score of 3 or more in  women, and 4 or more in men  indicates increased risk for alcohol abuse, EXCEPT if all of the points are from question 1   No results found for any visits on 01/29/21.  Assessment & Plan    Routine Health Maintenance and Physical Exam  Exercise Activities and Dietary recommendations  Goals   None     There is no immunization history for the selected administration types on file for this patient.  Health Maintenance  Topic Date Due   COVID-19 Vaccine (1) Never done   HIV Screening  Never done   Hepatitis C Screening  Never done   TETANUS/TDAP  Never done   INFLUENZA VACCINE  Never done   Pneumococcal Vaccine 79-43 Years old  Aged Out   HPV VACCINES  Aged Out    Discussed health benefits of physical activity, and encouraged him to engage in regular exercise appropriate for his age and condition.  1. Annual physical exam   2. Need for influenza vaccination  - Flu Vaccine QUAD 76mo+IM (Fluarix, Fluzone & Alfiuria Quad PF)  3. Need for Tdap vaccination  - Tdap vaccine greater than or equal to 7yo IM   Return in about 1 year (around 01/29/2022).     I, Megan Mans, MD, have reviewed all documentation for this visit. The documentation on 02/04/21 for the exam, diagnosis, procedures, and orders are all accurate and complete.    Keontay Vora Wendelyn Breslow, MD  [facility] [phone] (phone) [phone] (fax)  [facility]

## 2022-02-05 ENCOUNTER — Encounter: Payer: Self-pay | Admitting: Family Medicine

## 2022-06-27 IMAGING — CR DG CHEST 2V
1 series · 2 of 2 positions shown · non-contrast
Comparison: 05/19/2020

CLINICAL DATA: Cough, chest tightness

EXAM:
CHEST - 2 VIEW

[Series 1: dg chest 2 view · 0.14mm/px · 2 of 2 slices shown]
[im 1/2]
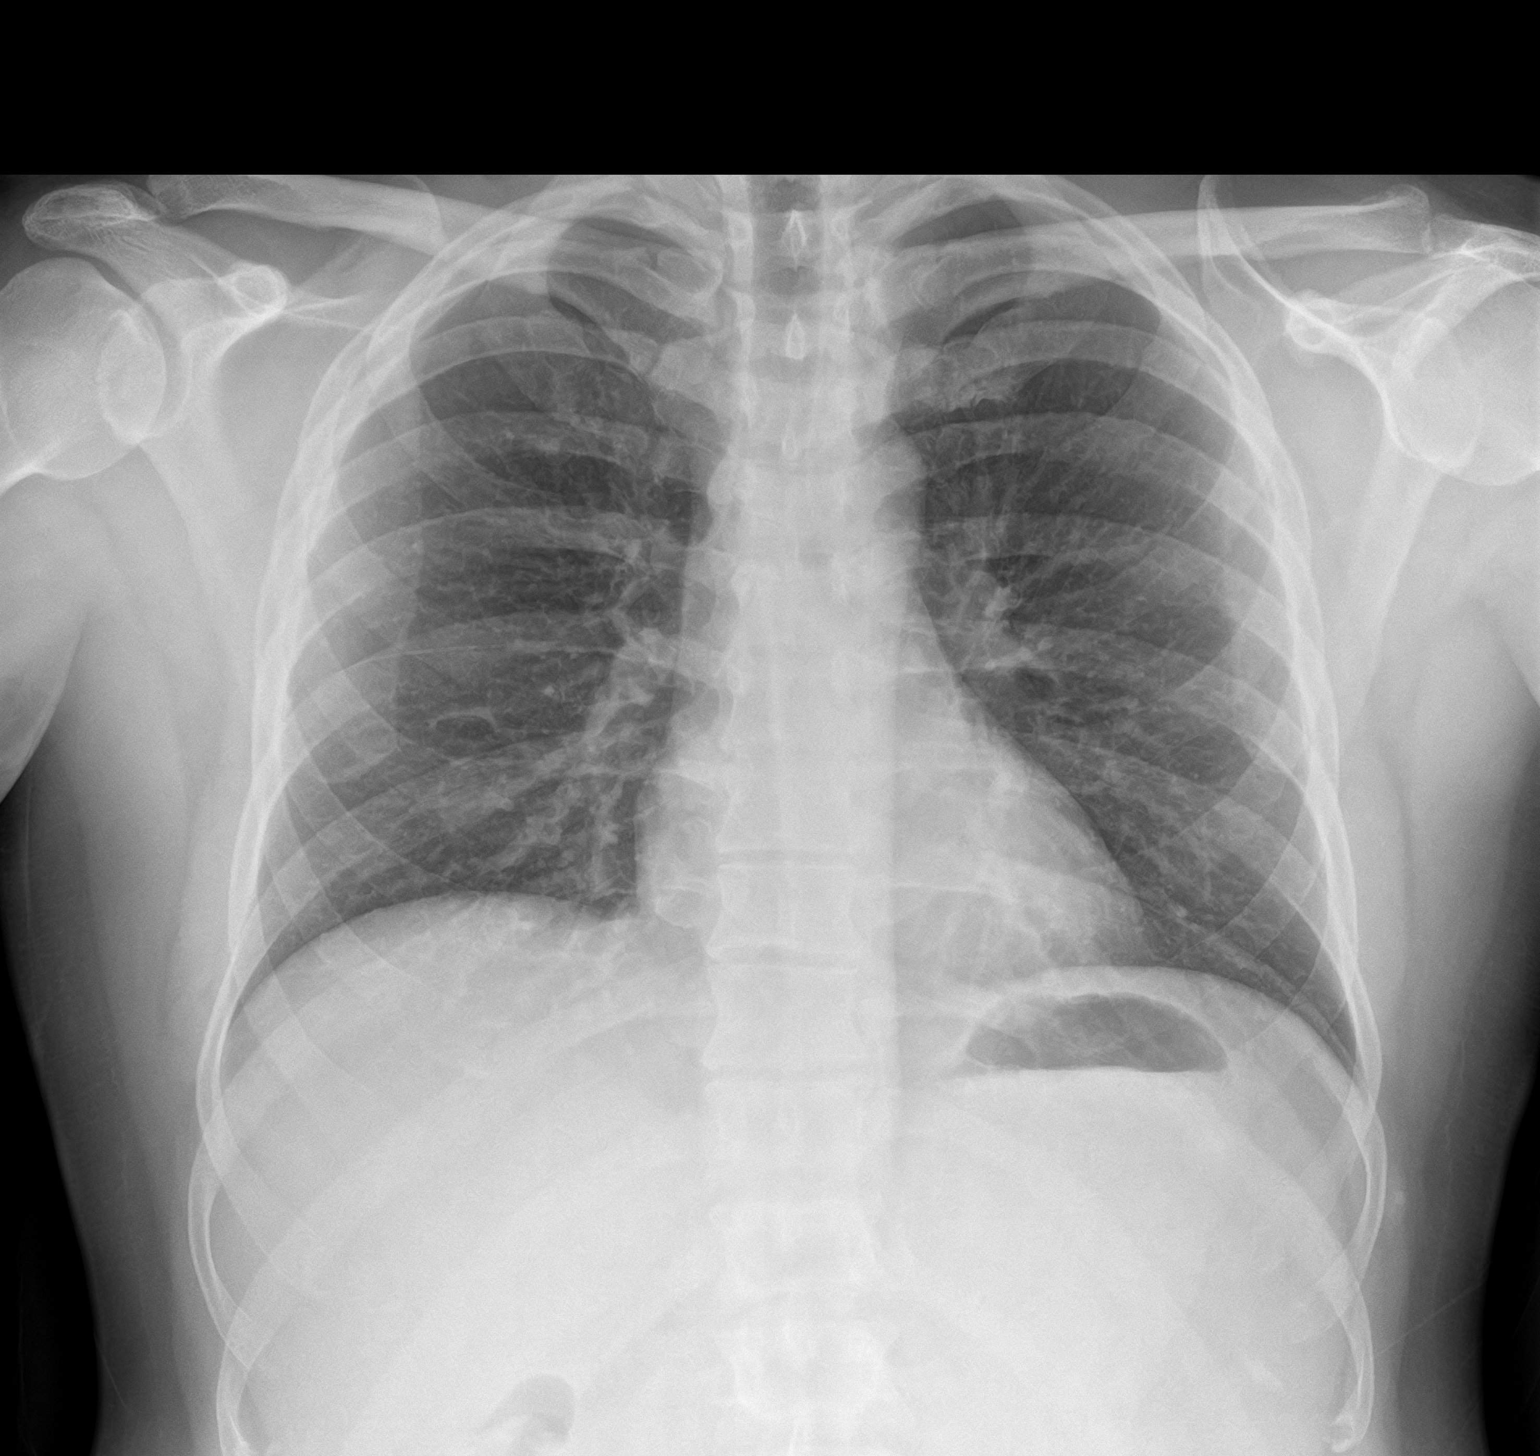
[im 2/2]
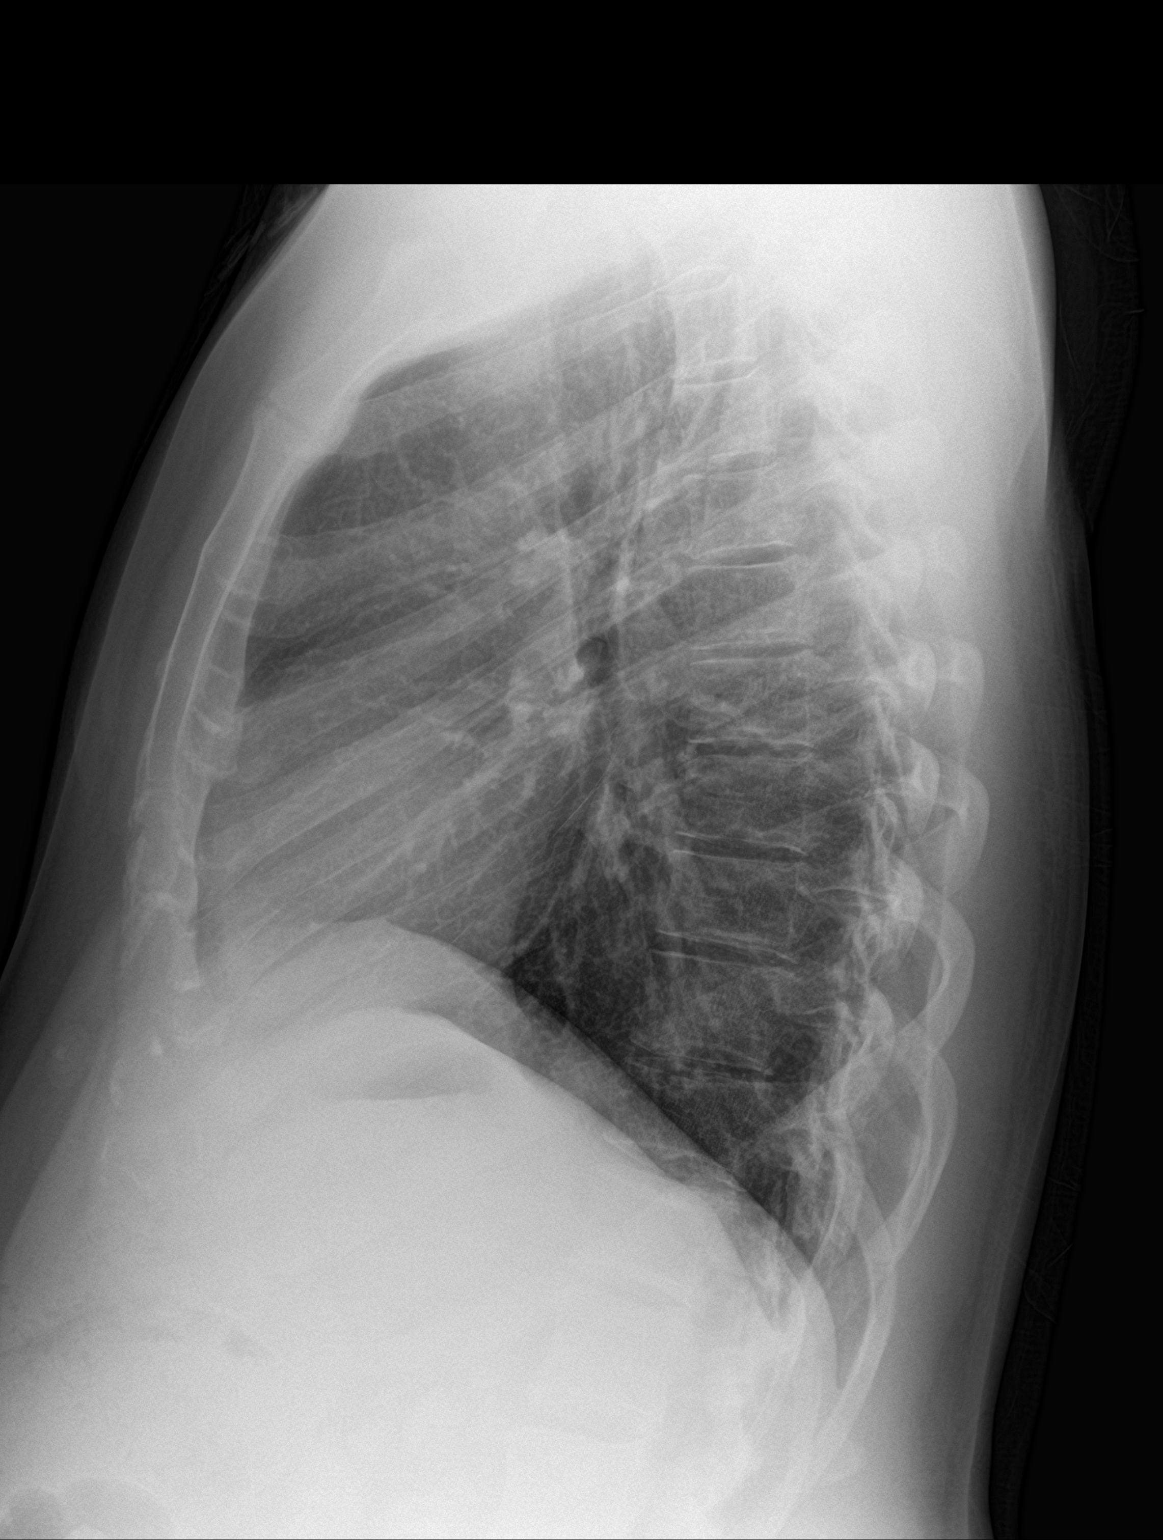

[2 of 2 positions shown; findings below may reference images not displayed]

FINDINGS: The heart size and mediastinal contours are within normal limits.
Both lungs are clear. The visualized skeletal structures are
unremarkable.
IMPRESSION: No acute abnormality of the lungs.

## 2023-01-15 ENCOUNTER — Other Ambulatory Visit: Payer: Self-pay

## 2023-01-15 ENCOUNTER — Emergency Department
Admission: EM | Admit: 2023-01-15 | Discharge: 2023-01-15 | Disposition: A | Discharge status: Home / Self Care | Payer: Self-pay | Attending: Emergency Medicine | Admitting: Emergency Medicine

## 2023-01-15 ENCOUNTER — Emergency Department: Payer: Self-pay

## 2023-01-15 DIAGNOSIS — N23 Unspecified renal colic: Secondary | ICD-10-CM

## 2023-01-15 DIAGNOSIS — N132 Hydronephrosis with renal and ureteral calculous obstruction: Secondary | ICD-10-CM | POA: Insufficient documentation

## 2023-01-15 HISTORY — DX: Personal history of other diseases of the nervous system and sense organs: Z86.69

## 2023-01-15 HISTORY — DX: Noninfective gastroenteritis and colitis, unspecified: K52.9

## 2023-01-15 LAB — BASIC METABOLIC PANEL
Anion gap: 7 (ref 5–15)
BUN: 13 mg/dL (ref 6–20)
CO2: 26 mmol/L (ref 22–32)
Calcium: 8.5 mg/dL — ABNORMAL LOW (ref 8.9–10.3)
Chloride: 104 mmol/L (ref 98–111)
Creatinine, Ser: 1.22 mg/dL (ref 0.61–1.24)
GFR, Estimated: >60 mL/min (ref >60)
Glucose, Bld: 122 mg/dL — ABNORMAL HIGH (ref 70–99)
Potassium: 3.5 mmol/L (ref 3.5–5.1)
Sodium: 137 mmol/L (ref 135–145)

## 2023-01-15 LAB — URINALYSIS, ROUTINE W REFLEX MICROSCOPIC
Bacteria, UA: NONE SEEN
Bilirubin Urine: NEGATIVE
Glucose, UA: NEGATIVE mg/dL
Ketones, ur: NEGATIVE mg/dL
Leukocytes,Ua: NEGATIVE
Nitrite: NEGATIVE
Protein, ur: NEGATIVE mg/dL
Specific Gravity, Urine: 1.033 — ABNORMAL HIGH (ref 1.005–1.030)
pH: 6 (ref 5.0–8.0)

## 2023-01-15 LAB — CBC
HCT: 42.4 % (ref 39.0–52.0)
Hemoglobin: 15 g/dL (ref 13.0–17.0)
MCH: 30.2 pg (ref 26.0–34.0)
MCHC: 35.4 g/dL (ref 30.0–36.0)
MCV: 85.3 fL (ref 80.0–100.0)
Platelets: 266 10*3/uL (ref 150–400)
RBC: 4.97 MIL/uL (ref 4.22–5.81)
RDW: 12.3 % (ref 11.5–15.5)
WBC: 6.5 10*3/uL (ref 4.0–10.5)
nRBC: 0 % (ref 0.0–0.2)

## 2023-01-15 MED ORDER — NAPROXEN 500 MG PO TABS: 500.0000 mg | ORAL_TABLET | Freq: Two times a day (BID) | ORAL | 0 refills | Status: AC

## 2023-01-15 MED ORDER — ONDANSETRON 4 MG PO TBDP: 4.0000 mg | ORAL_TABLET | Freq: Three times a day (TID) | ORAL | 0 refills | Status: AC | PRN

## 2023-01-15 MED ORDER — SODIUM CHLORIDE 0.9 % IV BOLUS
1000.0000 mL | Freq: Once | INTRAVENOUS | Status: AC
  Administered 2023-01-15: 1000 mL via INTRAVENOUS

## 2023-01-15 MED ORDER — KETOROLAC TROMETHAMINE 15 MG/ML IJ SOLN
15.0000 mg | Freq: Once | INTRAMUSCULAR | Status: AC
  Administered 2023-01-15: 15 mg via INTRAVENOUS
  Filled 2023-01-15: qty 1

## 2023-01-15 MED ORDER — IOHEXOL 300 MG/ML  SOLN
100.0000 mL | Freq: Once | INTRAMUSCULAR | Status: AC | PRN
  Administered 2023-01-15: 100 mL via INTRAVENOUS

## 2023-01-15 NOTE — ED Notes (Signed)
See triage note  Presents with sudden onset of left flank pain  States pain started at 7 am  States pain is moving into abd  Positive n/v  No fever

## 2023-01-15 NOTE — ED Triage Notes (Signed)
Pt here POV for left flank pain radiating to the front of ABD which started last night. No N/V, no dysuria

## 2023-01-15 NOTE — ED Notes (Signed)
Ambulates to bathroom  States pain is improving

## 2023-01-15 NOTE — ED Provider Notes (Signed)
[facility] Provider Note    None    (approximate)   History   Back Pain   HPI  Ryan Cooley is a 37 y.o. male who presents today for evaluation of left-sided flank pain radiating to his left lower quadrant that began this morning.  He denies history of kidney stone.  He has not had any hematuria or dysuria.  He has not had any fevers or chills.  He has not had any nausea or vomiting.  No injury.  No history of kidney stone.  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   Plantar fascial fibromatosis 01/25/2015   Problems influencing health status 01/25/2015          Physical Exam   Triage Vital Signs: ED Triage Vitals  Encounter Vitals Group     BP 01/15/23 0845 122/88     Systolic BP Percentile --      Diastolic BP Percentile --      Pulse Rate 01/15/23 0845 69     Resp 01/15/23 0845 16     Temp 01/15/23 0845 97.6 F (36.4 C)     Temp Source 01/15/23 0845 Oral     SpO2 01/15/23 0845 99 %     Weight 01/15/23 0850 199 lb 15.3 oz (90.7 kg)     Height 01/15/23 0850 5\' 5"  (1.651 m)     Head Circumference --      Peak Flow --      Pain Score 01/15/23 0844 10     Pain Loc --      Pain Education --      Exclude from Growth Chart --     Most recent vital signs: Vitals:   01/15/23 0845 01/15/23 1116  BP: 122/88 120/80  Pulse: 69 70  Resp: 16 16  Temp: 97.6 F (36.4 C)   SpO2: 99% 99%    Physical Exam Vitals and nursing note reviewed.  Constitutional:      General: Awake and alert. No acute distress.  Appears uncomfortable    Appearance: Normal appearance. The patient is normal weight.  HENT:     Head:  Normocephalic and atraumatic.     Mouth: Mucous membranes are moist.  Eyes:     General: PERRL. Normal EOMs        Right eye: No discharge.        Left eye: No discharge.     Conjunctiva/sclera: Conjunctivae normal.  Cardiovascular:     Rate and Rhythm: Normal rate and regular rhythm.     Pulses: Normal pulses.  Pulmonary:     Effort: Pulmonary effort is normal. No respiratory distress.     Breath sounds: Normal breath sounds.  Abdominal:     Abdomen is soft. There is mild left lower quadrant abdominal tenderness. No rebound or guarding. No distention.  Left CVA tenderness. Musculoskeletal:        General: No swelling. Normal range of motion.     Cervical back: Normal range of motion and neck supple.  Skin:    General: Skin is warm and dry.     Capillary Refill: Capillary refill takes less than 2 seconds.     Findings:  No rash.  Neurological:     Mental Status: The patient is awake and alert.      ED Results / Procedures / Treatments   Labs (all labs ordered are listed, but only abnormal results are displayed) Labs Reviewed  URINALYSIS, ROUTINE W REFLEX MICROSCOPIC - Abnormal; Notable for the following components:      Result Value   Color, Urine STRAW (*)    APPearance CLEAR (*)    Specific Gravity, Urine 1.033 (*)    Hgb urine dipstick MODERATE (*)    All other components within normal limits  BASIC METABOLIC PANEL - Abnormal; Notable for the following components:   Glucose, Bld 122 (*)    Calcium 8.5 (*)    All other components within normal limits  CBC     EKG     RADIOLOGY I independently reviewed and interpreted imaging and agree with radiologists findings.     PROCEDURES:  Critical Care performed:   Procedures   MEDICATIONS ORDERED IN ED: Medications  ketorolac (TORADOL) 15 MG/ML injection 15 mg (15 mg Intravenous Given 01/15/23 0949)  sodium chloride 0.9 % bolus 1,000 mL (0 mLs Intravenous Stopped 01/15/23 1115)  iohexol (OMNIPAQUE) 300 MG/ML  solution 100 mL (100 mLs Intravenous Contrast Given 01/15/23 1008)     IMPRESSION / MDM / ASSESSMENT AND PLAN / ED COURSE  I reviewed the triage vital signs and the nursing notes.   Differential diagnosis includes, but is not limited to, nephrolithiasis, pyelonephritis, diverticulitis.  Patient is awake and alert, hemodynamically stable and afebrile.  He is uncomfortable appearing.  Further workup is indicated.  IV was established and labs are obtained.  Labs are overall reassuring, normal creatinine, no leukocytosis, urinalysis reveals hemoglobin without evidence of infection.  CT demonstrates a 3 mm ureteral stone, likely the source of his pain today.  The stone is not infected.  He was given Toradol with complete resolution of pain.  Recommended close outpatient follow-up with urology in the appropriate formation was provided.  Given his good effect with NSAIDs, he was given a prescription for naproxen and Zofran.  I instructed him to call urology today to make an appointment for next week given that tomorrow is the weekend.  We discussed strict return precautions in the meantime.  Patient and his family understand and agree with plan.  He was discharged in stable condition.  Patient's presentation is most consistent with acute complicated illness / injury requiring diagnostic workup.   Clinical Course as of 01/15/23 1152  Fri Jan 15, 2023  1103 Mild left hydronephrosis due to 3 mm calculus at the left UPJ.  3 mm left renal calculus.  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   [JP]    Clinical Course User Index [JP] Marlia Schewe, Herb Grays, PA-C     FINAL CLINICAL IMPRESSION(S) / ED DIAGNOSES   Final diagnoses:  Ureteral colic     Rx / DC Orders   ED Discharge Orders          Ordered    naproxen (NAPROSYN) 500 MG tablet  2 times daily with meals        01/15/23 1107    ondansetron (ZOFRAN-ODT) 4 MG disintegrating tablet  Every 8 hours PRN         01/15/23 1107             Note:  This document was prepared using Dragon voice recognition software and may include unintentional dictation errors.  Jackelyn Hoehn, PA-C 01/15/23 1152    Janith Lima, MD 01/15/23 [phone]

## 2023-01-15 NOTE — Discharge Instructions (Addendum)
You are found to have a 3 mm kidney stone.  You may take the medication of the nausea medication as needed for pain.  Please follow-up with urology, you may call them today to make an appointment for next week.  Please return for any new, worsening, or change in symptoms or other concerns.  It was a pleasure caring for you today.

## 2023-07-07 ENCOUNTER — Ambulatory Visit (INDEPENDENT_AMBULATORY_CARE_PROVIDER_SITE_OTHER): Payer: Self-pay | Admitting: Family Medicine

## 2023-07-07 ENCOUNTER — Encounter: Payer: Self-pay | Admitting: Family Medicine

## 2023-07-07 VITALS — BP 122/68 | HR 75 | Ht 64.0 in | Wt 191.0 lb

## 2023-07-07 DIAGNOSIS — H6122 Impacted cerumen, left ear: Secondary | ICD-10-CM

## 2023-07-07 DIAGNOSIS — H9312 Tinnitus, left ear: Secondary | ICD-10-CM

## 2023-07-07 NOTE — Patient Instructions (Signed)
Ear Irrigation Ear irrigation is a procedure to wash dirt and wax out of your ear canal. It's also called lavage. You may need this if you're having trouble hearing because of wax in your ear. You may also have it done as part of the treatment for an ear infection.  Getting wax and dirt out of your ear can help ear drops work better. Tell a health care provider about: Any allergies you have. All medicines you're taking, including vitamins, herbs, eye drops, creams, and over-the-counter medicines. Any problems you or family members have had with anesthesia. Any bleeding problems you have. Any surgeries you've had. This includes any ear surgeries. Any medical conditions you have, such as any problems with your ear. Whether you're pregnant or may be pregnant. What are the risks? Your health care provider will talk with you about risks. These may include: Infection. Pain. Loss of hearing. Fluid and debris being pushed into your middle ear. This can happen if there are holes in your eardrum. The procedure not working. Trauma to your ear. Feeling dizzy, light-headed, or nauseous. What happens before the procedure? You'll talk with your provider about the procedure and plan. You may be given ear drops to put in your ear 15-20 minutes before the procedure. This helps loosen the wax. What happens during the procedure?  A syringe will be filled with water or a saline solution. Saline is made of salt and water. The syringe will be gently put into your ear. The fluid will be used to wash out wax and other debris. The procedure may vary among providers and hospitals. What can I expect after the procedure? Follow the instructions given to you by your provider. Follow these instructions at home: Using ear irrigation kits In some cases, you can use an ear irrigation kit at home. Ask your provider if this is an option for you. Use the kit only as told by your provider. Read the instructions on the  package. Follow the directions for using the syringe. Use water that's at room temperature. Do not use an ear irrigation kit if: You have diabetes. This can make you more likely to get an infection. You have a hole or tear in your eardrum. You have tubes in your ears. You've had ear surgery before. You've been told not to irrigate your ears. Cleaning your ears  Clean the outside of your ear with a soft washcloth each day. If told by your provider, use a few drops of baby oil, mineral oil, glycerin, hydrogen peroxide, or earwax softening drops. Do not use cotton swabs to clean your ears. These can push wax down into the ear canal. Do not put things into your ears to try to get rid of wax. This includes ear candles. General instructions Take over-the-counter and prescription medicines only as told by your provider. If you were prescribed antibiotics, use them as told by your provider. Do not stop using the antibiotic even if you start to feel better. Keep your ear clean and dry as told by your provider. See your provider at least once a year to have your ears and hearing checked. Contact a health care provider if: Your hearing isn't getting better. Your hearing is getting worse. You have pain or redness in your ear. You feel dizzy. You have ringing in your ears. You have nausea or vomiting. This information is not intended to replace advice given to you by your health care provider. Make sure you discuss any questions you have with  your health care provider. Document Revised: 06/11/2022 Document Reviewed: 06/11/2022 Elsevier Patient Education  2024 Elsevier Inc.   Please schedule a Follow-up Appointment to: No follow-ups on file.  If you have any other questions or concerns, please feel free to call the office or send a message through MyChart. You may also schedule an earlier appointment if necessary.  Additionally, you may be receiving a survey about your experience at our office  within a few days to 1 week by e-mail or mail. We value your feedback.  Saralyn Pilar, DO [facility], [place]

## 2023-07-07 NOTE — Progress Notes (Signed)
 Subjective:    Patient ID: Ryan Cooley, male    DOB: October 07, 1985, 38 y.o.   MRN: [id number]  Ryan Cooley is a 38 y.o. male presenting on 07/07/2023 for Ear Fullness (Left ear - fullness and pain, nasal congestion )  Patient presents for an ACUTE or SAME DAY appointment.   PCP Julieanne Manson MD at [facility]. Note last visit was in 2022.    Note - This patient's PCP works out of a different office within the [place] region. They have been scheduled with our office for a one time ACUTE visit today due to limited availability for acute appointment with their PCP's office. Additional follow-up and management beyond the scope of the discussion and treatment for today's visit will be addressed by their PCP's office.   HPI  Discussed the use of AI scribe software for clinical note transcription with the patient, who gave verbal consent to proceed.  History of Present Illness   Ryan Cooley is a 38 year old male who presents with ear discomfort and ringing following earwax removal at home with ear drops and bulb syringe.  He has been experiencing tenderness and ringing in his left ear following the removal of compacted earwax. The symptoms began four days ago after he used ear drops and a bulb syringe with hot water to flush out the wax. The ringing is intermittent and noticeable when he focuses on it, but it is not constantly loud.  He has attempted additional flushes since the initial removal, which did not worsen his symptoms. No liquid discharge from the ear has been noted  Since the wax removal, he has noticed improved hearing, although he occasionally experiences slight clogging, maintaining about 90-95% hearing capacity during these episodes.  His past medical history includes a previous ear infection, though he does not recall which ear was affected.          07/07/2023    2:44 PM 01/29/2021    2:20 PM 07/24/2020    1:19 PM   Depression screen PHQ 2/9  Decreased Interest 1 0 1  Down, Depressed, Hopeless 1 0 1  PHQ - 2 Score 2 0 2  Altered sleeping 0 1 0  Tired, decreased energy 3 1 3   Change in appetite 0 0 0  Feeling bad or failure about yourself  1 0 1  Trouble concentrating 2 0 1  Moving slowly or fidgety/restless 0 0 0  Suicidal thoughts 0 0 0  PHQ-9 Score 8 2 7   Difficult doing work/chores Somewhat difficult Not difficult at all Very difficult       07/07/2023    2:44 PM  GAD 7 : Generalized Anxiety Score  Nervous, Anxious, on Edge 1  Control/stop worrying 2  Worry too much - different things 2  Trouble relaxing 1  Restless 3  Easily annoyed or irritable 1  Afraid - awful might happen 0  Total GAD 7 Score 10  Anxiety Difficulty Somewhat difficult    Social History   Tobacco Use   Smoking status: Never   Smokeless tobacco: Never  Substance Use Topics   Alcohol use: No    Alcohol/week: 0.0 standard drinks of alcohol   Drug use: No    Review of Systems Per HPI unless specifically indicated above     Objective:    BP 122/68 (BP Location: Right Arm, Patient Position: Sitting, Cuff Size: Normal)   Pulse 75   Ht 5\' 4"  (1.626  m)   Wt 191 lb (86.6 kg)   SpO2 98%   BMI 32.79 kg/m   Wt Readings from Last 3 Encounters:  07/07/23 191 lb (86.6 kg)  01/15/23 199 lb 15.3 oz (90.7 kg)  01/29/21 200 lb (90.7 kg)    Physical Exam Vitals and nursing note reviewed.  Constitutional:      General: He is not in acute distress.    Appearance: Normal appearance. He is well-developed. He is not diaphoretic.     Comments: Well-appearing, comfortable, cooperative  HENT:     Head: Normocephalic and atraumatic.     Right Ear: Tympanic membrane, ear canal and external ear normal. There is no impacted cerumen.     Left Ear: Ear canal and external ear normal. There is impacted cerumen.     Ears:     Comments: Abnormal TM with soft impacted debris and cerumen. Eyes:     General:        Right  eye: No discharge.        Left eye: No discharge.     Conjunctiva/sclera: Conjunctivae normal.  Cardiovascular:     Rate and Rhythm: Normal rate.  Pulmonary:     Effort: Pulmonary effort is normal.  Skin:    General: Skin is warm and dry.     Findings: No erythema or rash.  Neurological:     Mental Status: He is alert and oriented to person, place, and time.  Psychiatric:        Mood and Affect: Mood normal.        Behavior: Behavior normal.        Thought Content: Thought content normal.     Comments: Well groomed, good eye contact, normal speech and thoughts    Ear Cerumen Removal  Date/Time: 07/07/2023 3:00 PM  Performed by: Smitty Cords, DO Authorized by: Smitty Cords, DO   Anesthesia: Local Anesthetic: none Location details: left ear Patient tolerance: patient tolerated the procedure well with no immediate complications Procedure type: irrigation  Sedation: Patient sedated: no       Results for orders placed or performed during the hospital encounter of 01/15/23  Basic metabolic panel   Collection Time: 01/15/23  8:48 AM  Result Value Ref Range   Sodium 137 135 - 145 mmol/L   Potassium 3.5 3.5 - 5.1 mmol/L   Chloride 104 98 - 111 mmol/L   CO2 26 22 - 32 mmol/L   Glucose, Bld 122 (H) 70 - 99 mg/dL   BUN 13 6 - 20 mg/dL   Creatinine, Ser 1.61 0.61 - 1.24 mg/dL   Calcium 8.5 (L) 8.9 - 10.3 mg/dL   GFR, Estimated >09 >60 mL/min   Anion gap 7 5 - 15  CBC   Collection Time: 01/15/23  8:48 AM  Result Value Ref Range   WBC 6.5 4.0 - 10.5 K/uL   RBC 4.97 4.22 - 5.81 MIL/uL   Hemoglobin 15.0 13.0 - 17.0 g/dL   HCT 45.4 09.8 - 11.9 %   MCV 85.3 80.0 - 100.0 fL   MCH 30.2 26.0 - 34.0 pg   MCHC 35.4 30.0 - 36.0 g/dL   RDW 14.7 82.9 - 56.2 %   Platelets 266 150 - 400 K/uL   nRBC 0.0 0.0 - 0.2 %  Urinalysis, Routine w reflex microscopic -Urine, Clean Catch   Collection Time: 01/15/23 10:36 AM  Result Value Ref Range   Color, Urine STRAW  (A) YELLOW   APPearance CLEAR (A) CLEAR  Specific Gravity, Urine 1.033 (H) 1.005 - 1.030   pH 6.0 5.0 - 8.0   Glucose, UA NEGATIVE NEGATIVE mg/dL   Hgb urine dipstick MODERATE (A) NEGATIVE   Bilirubin Urine NEGATIVE NEGATIVE   Ketones, ur NEGATIVE NEGATIVE mg/dL   Protein, ur NEGATIVE NEGATIVE mg/dL   Nitrite NEGATIVE NEGATIVE   Leukocytes,Ua NEGATIVE NEGATIVE   RBC / HPF 11-20 0 - 5 RBC/hpf   WBC, UA 0-5 0 - 5 WBC/hpf   Bacteria, UA NONE SEEN NONE SEEN   Squamous Epithelial / HPF 0-5 0 - 5 /HPF   Mucus PRESENT       Assessment & Plan:   Problem List Items Addressed This Visit   None Visit Diagnoses       Impacted cerumen of left ear    -  Primary     Tinnitus of left ear            Impacted Cerumen / Tinnitus and Ear Discomfort Tinnitus and tenderness in left ear post-cerumen removal. Tympanic membrane irritation with possible debris. No infection. Hearing improved.  - Perform ear irrigation to remove debris from left ear. Notable improvement after removal of cerumen impaction. Still has some mild tinnitus and irritation.  - Advise monitoring symptoms for improvement over next week.  -Contact PCP office for further follow-up and or referral to ENT if still not resolved 1-2 weeks, consider hearing eval for tinnitus if no further impaction or other abnormality detected    Orders Placed This Encounter  Procedures   Ear Cerumen Removal    This order was created via procedure documentation    No orders of the defined types were placed in this encounter.   Follow up plan: Return if symptoms worsen or fail to improve.   Saralyn Pilar, DO [facility] 07/07/2023, 2:56 PM

## 2023-07-29 ENCOUNTER — Ambulatory Visit (INDEPENDENT_AMBULATORY_CARE_PROVIDER_SITE_OTHER): Payer: Self-pay | Admitting: Family Medicine

## 2023-07-29 ENCOUNTER — Encounter: Payer: Self-pay | Admitting: Family Medicine

## 2023-07-29 VITALS — BP 120/76 | HR 78 | Ht 64.0 in | Wt 190.0 lb

## 2023-07-29 DIAGNOSIS — F900 Attention-deficit hyperactivity disorder, predominantly inattentive type: Secondary | ICD-10-CM

## 2023-07-29 DIAGNOSIS — Z7689 Persons encountering health services in other specified circumstances: Secondary | ICD-10-CM

## 2023-07-29 DIAGNOSIS — F330 Major depressive disorder, recurrent, mild: Secondary | ICD-10-CM

## 2023-07-29 MED ORDER — BUPROPION HCL ER (XL) 150 MG PO TB24: 150.0000 mg | ORAL_TABLET | Freq: Every morning | ORAL | 1 refills | Status: AC

## 2023-07-29 NOTE — Patient Instructions (Addendum)
 Thank you for coming to the office today.  For mood and depression / ADHD focus inattention  Rx Wellbutrin XL 150mg  daily in morning for both mood and focus.  Printed all orders Rx + Coupon GoodRx  Once you insurance coverage, we can adjust med, add others and discuss other treatment options, consider referral  These offices have both PSYCHIATRY doctors and Haematologist Available) [facility] [address] [place], [place] [address] Phone: [phone]  [facility] Address: [address] [place], [place] [address] [email or web address] Phone: [phone]  [facility]) Address: [address], [place], [place] [address] Hours: 8:30AM-5PM Phone: [phone]  [facility] (Adult, Peds, Geriatric, Counseling) [address], [address] [place], [place] [address] Phone: [phone] Fax: [phone]  [facility] [address] [place], [place] [address] Phone: [phone]  [facility] (All ages) [address] [place], [id number] Phone: [phone] (Option 1) [email or web address]  ----------------------------------------------------------------- THERAPIST ONLY  (No Psychiatry)  [facility] [address] [place], [place] [address] [place]  P: [phone]  [facility]) [facility], [facility] [address] [place], [place] [address] [phone]  [facility]   Address: [address] [place], [place] [address] Hours: Open today  9AM-7PM Phone: [phone]  [facility] [address], [facility]  - [facility] Address: [address] [place], [place] [address] Phone: [phone]  [facility] [place], [place] [address] Phone:  [phone]   Please schedule a Follow-up Appointment to: Return in about 3 months (around 10/28/2023), or if symptoms worsen or fail to improve, for 3 month ADHD Depression follow-up.  If you have any other questions or concerns, please feel free to call the office or send a message through MyChart. You may also schedule an earlier appointment if necessary.  Additionally, you may be receiving a survey about your experience at our office within a few days to 1 week by e-mail or mail. We value your feedback.  Domingo Friend, DO [facility], [place]

## 2023-07-29 NOTE — Progress Notes (Signed)
 Subjective:    Patient ID: Ryan Cooley, male    DOB: [birth year], 38 y.o.   MRN: [id number]  [name] is a 38 y.o. male presenting on 07/29/2023 for ADHD  HPI  Discussed the use of AI scribe software for clinical note transcription with the patient, who gave verbal consent to proceed.  History of Present Illness   Ryan Cooley is a 38 year old male with ADHD who presents for worsening symptoms and difficulty concentrating. He has returned to my care here at [facility] after seeing him for an acute visit recently from different PCP office. This is considered a transfer of care visit.  ADHD, Inattention Major Depression recurrent  Diagnosed with ADHD at a young age, he was initially treated with Ritalin starting in the first grade but discontinued the medication during his junior year of high school. Since then, he has not been on any ADHD medication.  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 He reports significant difficulty with concentration and executive dysfunction, describing it as 'hard to keep pushing through everything.'  He suspects there may be an overlap between his ADHD symptoms and depression, as he feels he may have depression in addition to ADHD. He is seeking treatment to manage these symptoms and improve his quality of life.  He is currently self-pay and is looking for cost-effective treatment options. He has not been on Wellbutrin before and is considering it as a treatment option for both ADHD and depression.  He is nearing the completion of his accounting degree and is actively seeking employment, hoping to improve his situation within the next 90 days.          07/29/2023    9:52 AM 07/07/2023    2:44 PM 01/29/2021    2:20 PM  Depression screen PHQ 2/9  Decreased Interest 1 1 0  Down, Depressed, Hopeless 1 1 0  PHQ - 2 Score 2 2 0  Altered  sleeping 1 0 1  Tired, decreased energy 2 3 1   Change in appetite 0 0 0  Feeling bad or failure about yourself  1 1 0  Trouble concentrating 2 2 0  Moving slowly or fidgety/restless 0 0 0  Suicidal thoughts 0 0 0  PHQ-9 Score 8 8 2   Difficult doing work/chores Very difficult Somewhat difficult Not difficult at all       07/29/2023    9:52 AM 07/07/2023    2:44 PM  GAD 7 : Generalized Anxiety Score  Nervous, Anxious, on Edge 2 1  Control/stop worrying 2 2  Worry too much - different things 2 2  Trouble relaxing 1 1  Restless 3 3  Easily annoyed or irritable 2 1  Afraid - awful might happen 2 0  Total GAD 7 Score 14 10  Anxiety Difficulty Very difficult Somewhat difficult    Social History   Tobacco Use   Smoking status: Never   Smokeless tobacco: Never  Substance Use Topics   Alcohol use: No    Alcohol/week: 0.0 standard drinks of alcohol   Drug use: No    Review of Systems Per HPI unless specifically indicated above     Objective:    BP 120/76 (BP Location: Right Arm, Patient Position: Sitting, Cuff Size: Normal)   Pulse 78   Ht 5\' 4"  (1.626 m)   Wt 190 lb (86.2 kg)   BMI 32.61 kg/m  Wt Readings from Last 3 Encounters:  07/29/23 190 lb (86.2 kg)  07/07/23 191 lb (86.6 kg)  01/15/23 199 lb 15.3 oz (90.7 kg)    Physical Exam Vitals and nursing note reviewed.  Constitutional:      General: He is not in acute distress.    Appearance: Normal appearance. He is well-developed. He is not diaphoretic.     Comments: Well-appearing, comfortable, cooperative  HENT:     Head: Normocephalic and atraumatic.  Eyes:     General:        Right eye: No discharge.        Left eye: No discharge.     Conjunctiva/sclera: Conjunctivae normal.  Cardiovascular:     Rate and Rhythm: Normal rate.  Pulmonary:     Effort: Pulmonary effort is normal.  Skin:    General: Skin is warm and dry.     Findings: No erythema or rash.  Neurological:     Mental Status: He is alert and  oriented to person, place, and time.  Psychiatric:        Mood and Affect: Mood normal.        Behavior: Behavior normal.        Thought Content: Thought content normal.     Comments: Well groomed, good eye contact, normal speech and thoughts     Results for orders placed or performed during the hospital encounter of 01/15/23  Basic metabolic panel   Collection Time: 01/15/23  8:48 AM  Result Value Ref Range   Sodium 137 135 - 145 mmol/L   Potassium 3.5 3.5 - 5.1 mmol/L   Chloride 104 98 - 111 mmol/L   CO2 26 22 - 32 mmol/L   Glucose, Bld 122 (H) 70 - 99 mg/dL   BUN 13 6 - 20 mg/dL   Creatinine, Ser 1.61 0.61 - 1.24 mg/dL   Calcium 8.5 (L) 8.9 - 10.3 mg/dL   GFR, Estimated >09 >60 mL/min   Anion gap 7 5 - 15  CBC   Collection Time: 01/15/23  8:48 AM  Result Value Ref Range   WBC 6.5 4.0 - 10.5 K/uL   RBC 4.97 4.22 - 5.81 MIL/uL   Hemoglobin 15.0 13.0 - 17.0 g/dL   HCT 45.4 09.8 - 11.9 %   MCV 85.3 80.0 - 100.0 fL   MCH 30.2 26.0 - 34.0 pg   MCHC 35.4 30.0 - 36.0 g/dL   RDW 14.7 82.9 - 56.2 %   Platelets 266 150 - 400 K/uL   nRBC 0.0 0.0 - 0.2 %  Urinalysis, Routine w reflex microscopic -Urine, Clean Catch   Collection Time: 01/15/23 10:36 AM  Result Value Ref Range   Color, Urine STRAW (A) YELLOW   APPearance CLEAR (A) CLEAR   Specific Gravity, Urine 1.033 (H) 1.005 - 1.030   pH 6.0 5.0 - 8.0   Glucose, UA NEGATIVE NEGATIVE mg/dL   Hgb urine dipstick MODERATE (A) NEGATIVE   Bilirubin Urine NEGATIVE NEGATIVE   Ketones, ur NEGATIVE NEGATIVE mg/dL   Protein, ur NEGATIVE NEGATIVE mg/dL   Nitrite NEGATIVE NEGATIVE   Leukocytes,Ua NEGATIVE NEGATIVE   RBC / HPF 11-20 0 - 5 RBC/hpf   WBC, UA 0-5 0 - 5 WBC/hpf   Bacteria, UA NONE SEEN NONE SEEN   Squamous Epithelial / HPF 0-5 0 - 5 /HPF   Mucus PRESENT       Assessment & Plan:   Problem List Items Addressed This Visit   None Visit Diagnoses  Attention deficit hyperactivity disorder (ADHD), predominantly  inattentive type    -  Primary   Relevant Medications   buPROPion (WELLBUTRIN XL) 150 MG 24 hr tablet     Mild episode of recurrent major depressive disorder (HCC)       Relevant Medications   buPROPion (WELLBUTRIN XL) 150 MG 24 hr tablet     Encounter to establish care            Attention-Deficit/Hyperactivity Disorder (ADHD) Chronic problem. ADHD symptoms worsened, affecting concentration and daily functioning. Wellbutrin chosen for dual action on depression and ADHD, cost-effectiveness, and non-controlled status. - Prescribe Wellbutrin XL 150 mg once daily in the morning for 90 days. - Provide GoodRx coupon for cost-effective purchase options. - Print prescription for pharmacy flexibility. - Discuss potential future addition of stimulant medication if needed. Past history on Ritalin - Consider future ADHD evaluation with insurance coverage.  Major Depressive Disorder Suspected concurrent major depressive disorder. Wellbutrin selected for efficacy in treating depression and ADHD, aligning with financial constraints. - Prescribe Wellbutrin XL 150 mg once daily in the morning for 90 days. - Provide GoodRx coupon for cost-effective purchase options. - Print prescription for pharmacy flexibility. - Discuss potential medication adjustments with future insurance coverage.  Follow-up Plan to reassess Wellbutrin effectiveness and symptom management in 90 days. Discussed importance of follow-up for treatment adjustment. He plans to seek employment and insurance coverage. - Schedule follow-up appointment in 90 days to assess treatment efficacy and discuss further management. - Provide list of psychiatry and therapy resources for future reference with insurance coverage.        No orders of the defined types were placed in this encounter.   Meds ordered this encounter  Medications   buPROPion (WELLBUTRIN XL) 150 MG 24 hr tablet    Sig: Take 1 tablet (150 mg total) by mouth every  morning.    Dispense:  90 tablet    Refill:  1    Follow up plan: Return in about 3 months (around 10/28/2023) for 3 month ADHD Depression follow-up.   Domingo Friend, DO [facility] 07/29/2023, 10:05 AM
